# Patient Record
Sex: Male | Born: 1950 | Race: White | Hispanic: No | Marital: Married | Smoking: Former smoker
Health system: Southern US, Community
[De-identification: ages and names within clinical notes are randomized; demographics above are authoritative.]

## PROBLEM LIST (undated history)

## (undated) DIAGNOSIS — K900 Celiac disease: Secondary | ICD-10-CM

## (undated) DIAGNOSIS — M1990 Unspecified osteoarthritis, unspecified site: Secondary | ICD-10-CM

## (undated) DIAGNOSIS — G4730 Sleep apnea, unspecified: Secondary | ICD-10-CM

## (undated) DIAGNOSIS — K219 Gastro-esophageal reflux disease without esophagitis: Secondary | ICD-10-CM

## (undated) DIAGNOSIS — E119 Type 2 diabetes mellitus without complications: Secondary | ICD-10-CM

## (undated) DIAGNOSIS — K5790 Diverticulosis of intestine, part unspecified, without perforation or abscess without bleeding: Secondary | ICD-10-CM

## (undated) DIAGNOSIS — K648 Other hemorrhoids: Secondary | ICD-10-CM

## (undated) DIAGNOSIS — K2210 Ulcer of esophagus without bleeding: Secondary | ICD-10-CM

## (undated) DIAGNOSIS — E785 Hyperlipidemia, unspecified: Secondary | ICD-10-CM

## (undated) HISTORY — DX: Ulcer of esophagus without bleeding: K22.10

## (undated) HISTORY — DX: Type 2 diabetes mellitus without complications: E11.9

## (undated) HISTORY — PX: COLONOSCOPY: SHX174

## (undated) HISTORY — DX: Other hemorrhoids: K64.8

## (undated) HISTORY — DX: Hyperlipidemia, unspecified: E78.5

## (undated) HISTORY — DX: Sleep apnea, unspecified: G47.30

## (undated) HISTORY — DX: Celiac disease: K90.0

## (undated) HISTORY — PX: TONSILLECTOMY: SUR1361

## (undated) HISTORY — DX: Unspecified osteoarthritis, unspecified site: M19.90

## (undated) HISTORY — DX: Diverticulosis of intestine, part unspecified, without perforation or abscess without bleeding: K57.90

## (undated) HISTORY — DX: Gastro-esophageal reflux disease without esophagitis: K21.9

## (undated) HISTORY — PX: POLYPECTOMY: SHX149

## (undated) HISTORY — PX: UPPER GASTROINTESTINAL ENDOSCOPY: SHX188

---

## 2003-04-16 ENCOUNTER — Ambulatory Visit (HOSPITAL_COMMUNITY): Admission: RE | Admit: 2003-04-16 | Discharge: 2003-04-16 | Payer: Self-pay | Admitting: *Deleted

## 2003-04-16 ENCOUNTER — Encounter (INDEPENDENT_AMBULATORY_CARE_PROVIDER_SITE_OTHER): Payer: Self-pay | Admitting: Specialist

## 2005-07-10 ENCOUNTER — Encounter (INDEPENDENT_AMBULATORY_CARE_PROVIDER_SITE_OTHER): Payer: Self-pay | Admitting: *Deleted

## 2005-07-10 ENCOUNTER — Ambulatory Visit (HOSPITAL_COMMUNITY): Admission: RE | Admit: 2005-07-10 | Discharge: 2005-07-10 | Payer: Self-pay | Admitting: *Deleted

## 2005-09-28 ENCOUNTER — Encounter (INDEPENDENT_AMBULATORY_CARE_PROVIDER_SITE_OTHER): Payer: Self-pay | Admitting: *Deleted

## 2005-09-28 ENCOUNTER — Ambulatory Visit (HOSPITAL_COMMUNITY): Admission: RE | Admit: 2005-09-28 | Discharge: 2005-09-28 | Payer: Self-pay | Admitting: *Deleted

## 2005-12-03 ENCOUNTER — Ambulatory Visit (HOSPITAL_COMMUNITY): Admission: RE | Admit: 2005-12-03 | Discharge: 2005-12-03 | Payer: Self-pay | Admitting: *Deleted

## 2007-08-22 ENCOUNTER — Encounter: Admission: RE | Admit: 2007-08-22 | Discharge: 2007-08-22 | Payer: Self-pay | Admitting: Endocrinology

## 2010-12-08 NOTE — Op Note (Signed)
NAME:  Trevor Evans, Trevor Evans             ACCOUNT NO.:  [id number]   MEDICAL RECORD NO.:  [id number]          PATIENT TYPE:  AMB   LOCATION:  ENDO                         FACILITY:  [facility]   PHYSICIAN:  George M. Orr, M.D.    DATE OF BIRTH:  05/24/1951   DATE OF PROCEDURE:  09/28/2005  DATE OF DISCHARGE:                                 OPERATIVE REPORT   PROCEDURE:  Upper endoscopy with biopsy.   INDICATIONS FOR PROCEDURE:  GERD.   ANESTHESIA:  Demerol and Versed.   PROCEDURE:  With the patient mildly sedated in the left lateral decubitus  position, the Olympus videoscopic endoscope was inserted and passed under  direct vision through the esophagus which appeared normal until we reached  distal esophagus and there was question of Barrett's photographed, biopsied.  We entered into the stomach.  The fundus, body, antrum and duodenal bulb,  second portion duodenum all appeared normal. From this point the endoscope  was slowly withdrawn taking circumferential views of duodenal mucosa until  the endoscope been pulled back in the stomach placed in retroflexion, view  the stomach from below.  The endoscope was straightened, withdrawn taking  circumferential views remaining gastric and esophageal mucosa. The patient's  vital signs, pulse oximeter remained stable. The patient tolerated procedure  well without apparent complications.   FINDINGS:  Question of Barrett's esophagus, biopsied. Await biopsy report.  The patient will call me for results and follow-up with me as an outpatient.           ______________________________  George M. Orr, M.D.     GMO/MEDQ  D:  09/28/2005  T:  09/29/2005  Job:  [id number] 

## 2010-12-08 NOTE — Op Note (Signed)
[name], Trevor Evans             ACCOUNT NO.:  [id number]   MEDICAL RECORD NO.:  [id number]          PATIENT TYPE:  AMB   LOCATION:  ENDO                         FACILITY:  [facility]   PHYSICIAN:  Georgiana Spinner, M.D.    DATE OF BIRTH:  Oct 25, 1950   DATE OF PROCEDURE:  09/28/2005  DATE OF DISCHARGE:                                 OPERATIVE REPORT   PROCEDURE:  Upper endoscopy with biopsy.   INDICATIONS FOR PROCEDURE:  GERD.   ANESTHESIA:  Demerol and Versed.   PROCEDURE:  With the patient mildly sedated in the left lateral decubitus  position, the Olympus videoscopic endoscope was inserted and passed under  direct vision through the esophagus which appeared normal until we reached  distal esophagus and there was question of Barrett's photographed, biopsied.  We entered into the stomach.  The fundus, body, antrum and duodenal bulb,  second portion duodenum all appeared normal. From this point the endoscope  was slowly withdrawn taking circumferential views of duodenal mucosa until  the endoscope been pulled back in the stomach placed in retroflexion, view  the stomach from below.  The endoscope was straightened, withdrawn taking  circumferential views remaining gastric and esophageal mucosa. The patient's  vital signs, pulse oximeter remained stable. The patient tolerated procedure  well without apparent complications.   FINDINGS:  Question of Barrett's esophagus, biopsied. Await biopsy report.  The patient will call me for results and follow-up with me as an outpatient.           ______________________________  Georgiana Spinner, M.D.     GMO/MEDQ  D:  09/28/2005  T:  09/29/2005  Job:  [id number]

## 2010-12-08 NOTE — Op Note (Signed)
Trevor Evans, Trevor Evans             ACCOUNT NO.:  [id number]   MEDICAL RECORD NO.:  [id number]          PATIENT TYPE:  AMB   LOCATION:  ENDO                         FACILITY:  [facility]   PHYSICIAN:  Georgiana Spinner, M.D.    DATE OF BIRTH:  [birth year]   DATE OF PROCEDURE:  07/10/2005  DATE OF DISCHARGE:                                 OPERATIVE REPORT   PROCEDURE:  Colonoscopy.   ENDOSCOPIST:  Georgiana Spinner, M.D.   INDICATIONS:  Hemoccult positivity.   ANESTHESIA:  Demerol 50 mg, Versed 3 mg.   PROCEDURE:  With the patient mildly sedated in the left lateral decubitus  position, a rectal examination was attempted.  The prostate could not be  palpated, but subsequently the Olympus videoscopic colonoscope was inserted  in the rectum and passed through a tight diverticular-filled sigmoid colon  to reach the cecum, identified by ileocecal valve and appendiceal orifice,  both of which were photographed.  From this point the colonoscope was slowly  withdrawn, taking circumferential views of the colonic mucosa, stopping then  only in the rectum, which appeared normal on direct and showed hemorrhoids  on retroflexed view.  The endoscope was straightened and withdrawn.  The  patient's vital signs and pulse oximetry remained stable.  The patient  tolerated the procedure well without apparent complication.   FINDINGS:  1.  Diverticulosis of sigmoid colon.  2.  Internal hemorrhoids.  3.  Otherwise an unremarkable colonoscopic examination to the cecum.   PLAN:  See endoscopy notes for further plan for followup.           ______________________________  Georgiana Spinner, M.D.     GMO/MEDQ  D:  07/10/2005  T:  07/12/2005  Job:  [id number]

## 2010-12-08 NOTE — Op Note (Signed)
Trevor Evans, Trevor Evans             ACCOUNT NO.:  [id number]   MEDICAL RECORD NO.:  [id number]          PATIENT TYPE:  AMB   LOCATION:  ENDO                         FACILITY:  [facility]   PHYSICIAN:  Georgiana Spinner, M.D.    DATE OF BIRTH:  [birth year]   DATE OF PROCEDURE:  07/10/2005  DATE OF DISCHARGE:  07/10/2005                                 OPERATIVE REPORT   PROCEDURE:  Upper endoscopy.   ENDOSCOPIST:  Georgiana Spinner, M.D.   INDICATIONS:  Hemoccult positivity.   ANESTHESIA:  Demerol 50 mg, Versed 5 mg.   PROCEDURE:  With the patient mildly sedated in the left lateral decubitus  position, the Olympus videoscopic endoscope was inserted in the mouth and  passed under direct vision through the esophagus which appeared normal until  it reached the distal esophagus and there were discrete ulcerations seen and  numerous small ulcers, one long linear ulcer. These were photographed and  biopsied.  They appeared to be NSAID-induced. We saw a small polyp in the  gastric fundus just below the squamocolumnar junction.  This was  photographed and biopsied as well in a separate container.  We advanced into  the stomach.  The fundus, body, antrum, duodenal bulb and second portion of  the duodenum were visualized.  From this point, the endoscope was slowly  withdrawn taking circumferential views of the duodenal mucosa until the  endoscope had been pulled back into the stomach, placed in retroflexion to  view the stomach from below.  The endoscope was then straightened and  withdrawn taking circumferential views of the remaining gastric and  esophageal mucosa. The patient's vital signs, pulse oximeter remained  stable. The patient tolerated procedure well without apparent complications.   FINDINGS:  Small polyp of fundus biopsied.  Ulcerations of distal esophagus  presumably NSAID induced.   PLAN:  1.  Await biopsy report, but in the interim will start the patient on PPI,      possibly  over-the-counter Prilosec, to be taken daily and will have the      patient follow-up with me as an outpatient.  2.  Proceed to colonoscopy as planned.           ______________________________  Georgiana Spinner, M.D.     GMO/MEDQ  D:  07/10/2005  T:  07/12/2005  Job:  [id number]

## 2010-12-08 NOTE — Op Note (Signed)
   NAME:  Trevor Evans, Trevor Evans                       ACCOUNT NO.:  [id number]   MEDICAL RECORD NO.:  [id number]                   PATIENT TYPE:  AMB   LOCATION:  ENDO                                 [facility]ACILITY:  [facility]   PHYSICIAN:  Georgiana Spinner, M.D.                 DATE OF BIRTH:  1950/10/18   DATE OF PROCEDURE:  04/16/2003  DATE OF DISCHARGE:                                 OPERATIVE REPORT   PROCEDURE:  Colonoscopy.   INDICATIONS:  Colon polyp.   ANESTHESIA:  1. Demerol 100 mg.  2. Versed 9 mg.   DESCRIPTION OF PROCEDURE:  With patient mildly sedated in the left lateral  decubitus position, the Olympus videoscopic colonoscope was inserted in the  rectum, passed under direct vision to the cecum, identified by the ileocecal  valve and the appendiceal orifice, both of which were photographed.  From  this point, the colonoscope was slowly withdrawn, taking circumferential  views of the colonic mucosa, stopping to photograph diverticula seen along  the way in the sigmoid colon until we reached the area of approximately 30  cm from the anal verge at which a small polyp was seen, photographed, and  removed using hot biopsy forceps technique, setting of 20-20 blended  current.  Tissue was retrieved.  The endoscope was then withdrawn all the  way to the rectum which appeared normal on direct and showed normal on  retroflexed view as well.  The endoscope was straightened and withdrawn.  The patient's vital signs and pulse oximeter remained stable.  The patient  tolerated the procedure well without apparent complications.   FINDINGS:  Diverticulosis of sigmoid colon with a small polyp as noted.   PLAN:  1. Await biopsy report.  2. The patient will call me for results and follow up with me as an     outpatient.                                               Georgiana Spinner, M.D.    GMO/MEDQ  D:  04/16/2003  T:  04/16/2003  Job:  [id number]

## 2011-03-13 ENCOUNTER — Ambulatory Visit: Payer: Self-pay | Admitting: Internal Medicine

## 2011-04-09 ENCOUNTER — Encounter: Payer: Self-pay | Admitting: Internal Medicine

## 2011-04-09 ENCOUNTER — Ambulatory Visit: Payer: BC Managed Care – PPO

## 2011-04-09 ENCOUNTER — Ambulatory Visit (INDEPENDENT_AMBULATORY_CARE_PROVIDER_SITE_OTHER): Payer: BC Managed Care – PPO | Admitting: Internal Medicine

## 2011-04-09 VITALS — BP 130/70 | HR 68 | Ht 68.0 in | Wt 284.0 lb

## 2011-04-09 DIAGNOSIS — E785 Hyperlipidemia, unspecified: Secondary | ICD-10-CM | POA: Insufficient documentation

## 2011-04-09 DIAGNOSIS — K900 Celiac disease: Secondary | ICD-10-CM

## 2011-04-09 DIAGNOSIS — Z1211 Encounter for screening for malignant neoplasm of colon: Secondary | ICD-10-CM

## 2011-04-09 LAB — IGA: IgA: 284 mg/dL (ref 68–378)

## 2011-04-09 LAB — IBC PANEL: Saturation Ratios: 6.7 % — ABNORMAL LOW (ref 20.0–50.0)

## 2011-04-09 LAB — FERRITIN: Ferritin: 11.4 ng/mL — ABNORMAL LOW (ref 22.0–322.0)

## 2011-04-09 NOTE — Patient Instructions (Signed)
You have been scheduled for labs today please go to the basement to have this done. Please call our office if you develop any symptoms otherwise follow up as needed. You have been put in our system for a recall colonoscopy 03/2015.

## 2011-04-09 NOTE — Progress Notes (Signed)
Subjective:    Patient ID: Trevor Evans, male    DOB: [birth year], 60 y.o.   MRN: [id number]  HPI Trevor Evans is a 60 year old male with a PMH of celiac disease, diet controlled diabetes, hyperlipidemia and sleep apnea who is referred by Trevor Evans for consideration of repeat colonoscopy and for a celiac disease. The patient reports today he is doing very well. He denies any abdominal complaints. He reports no abdominal pain, no nausea, no vomiting. His appetite has been good. His weight has been stable. He denies heartburn of late.  No dysphagia or odynophagia.  He reports normal bowel movements without constipation or diarrhea. He denies bright red blood per rectum or melena. No fevers or chills.  The patient states that he was diagnosed with celiac disease about 5 years ago after being found to have iron deficiency.  He reports no GI symptoms at that time. He reports the diagnosis was by serology. To his knowledge he has never had small bowel biopsy. He reports that he maintains a strict gluten free diet and with this his iron studies have reportedly normalized.   Review of Systems Constitutional: Negative for fever, chills, night sweats, activity change, appetite change and unexpected weight change HEENT: Negative for sore throat, mouth sores and trouble swallowing. Eyes: Negative for visual disturbance Respiratory: Negative for cough, chest tightness and shortness of breath Cardiovascular: Negative for chest pain, palpitations and lower extremity swelling Gastrointestinal: See history of present illness Genitourinary: Negative for dysuria and hematuria. Musculoskeletal: Negative for back pain, arthralgias and myalgias Skin: Negative for rash or color change Neurological: Negative for headaches, weakness, numbness Hematological: Negative for adenopathy, negative for easy bruising/bleeding Psychiatric/behavioral: Negative for depressed mood, negative for anxiety   Past Medical  History  Diagnosis Date  . Celiac disease   . Hyperlipidemia   . Diabetes mellitus type II     diet controlled  . GERD (gastroesophageal reflux disease)   . Sleep apnea   . Diverticulosis   . Internal hemorrhoids   . Esophageal ulcer   . GI bleed    Current Outpatient Prescriptions  Medication Sig Dispense Refill  . aspirin 81 MG tablet Take 81 mg by mouth daily.        . Multiple Vitamins-Minerals (CENTRUM SILVER PO) Take 1 tablet by mouth daily.         No Known Allergies  Family History  Problem Relation Age of Onset  . Diabetes Father   . Kidney disease Father   . Emphysema Father   . Migraines Mother   --neg for CRC or colon polyps.  No known celiac   Social History  . Marital Status: Married    Number of Children: 1   Occupational History  . teacher [employer]   Social History Main Topics  . Smoking status: Former Smoker    Quit date: 07/23/1973  . Smokeless tobacco: Never Used  . Alcohol Use: No  . Drug Use: No      Objective:   Physical Exam BP 130/70  Pulse 68  Ht 5\' 8"  (1.727 m)  Wt 284 lb (128.822 kg)  BMI 43.18 kg/m2 Constitutional: Well-developed and well-nourished. No distress. HEENT: Normocephalic and atraumatic. Oropharynx is clear and moist. No oropharyngeal exudate. Conjunctivae are normal. Pupils are equal round and reactive to light. No scleral icterus. Neck: Neck supple. Trachea midline. Cardiovascular: Normal rate, regular rhythm and intact distal pulses. No M/R/G Pulmonary/chest: Effort normal and breath sounds normal. No wheezing, rales or rhonchi.  Abdominal: Soft, obese, nontender, nondistended. Bowel sounds active throughout. There are no masses palpable. No hepatosplenomegaly. Lymphadenopathy: No cervical adenopathy noted. Neurological: Alert and oriented to person place and time. Skin: Skin is warm and dry. No rashes noted.  Several small cherry angiomas noted anterior abdominal wall Psychiatric: Normal mood and affect.  Behavior is normal.  Prior Procedures: Colonoscopy 07/10/2005: Diverticulosis of the sigmoid, internal hemorrhoids, otherwise an unremarkable colonoscopic examination to the cecum Colonoscopy 04/16/2003: Diverticulosis of the sigmoid, with small polyp in the sigmoid removed by biopsy. Pathology: Benign colonic mucosa  With benign lymphoid aggregate. No adenomatous change or malignancy identified.  EGD 09/28/2005: Question of Barrett's esophagus, biopsied. Pathology: Esophagogastric junction mucosa with mild inflammation. No intestinal metaplasia dysplasia or malignancy  identifed EGD 07/10/2005: Small polyp in the fundus biopsy. Ulcerations of distal esophagus presumably NSAID-induced. Pathology: Mucosal ulceration associated with inflamed granulation tissue in the ulcer bed. No intestinal metaplasia, atypia or evidence of malignancy identified. Stomach: Gastroesophageal junction type mucosa associated with marked acute and chronic inflammation and hyperplastic/regenerative changes. No definite organism or H. pylori identified. No intestinal metaplasia or evidence of malignancy.  Labs: 02/14/2011: WBC 7.4, hemoglobin of 13.1, hematocrit 39.0, MCV 82.8, platelet count 257 Sodium 136 potassium 4.6 chloride 103 carbon monoxide 30 BUN 13 creatinine 1.1 Calcium 8.5 total protein 7.2 albumin 3.4 globulin 3.8 Total bili 0.3 alk phosphatase 110 AST 15 ALT 31 A1c 6.3     Assessment & Plan:  This is a 60 year old male with a PMH of celiac disease, diet controlled diabetes, hyperlipidemia and sleep apnea who is referred by his PCP for consideration of repeat colonoscopy and for a celiac disease  1. Celiac disease -- the patient had mild celiac disease, though apparently a significant iron deficiency at diagnosis.  He is maintained on a strict gluten-free diet and is without symptoms. His MCV is now normal which suggests his iron stores have been repleted.  I will repeat his serum TTG antibody today to  ensure normalization on the gluten-free diet. I'll also order iron studies. At present I do not see any intervention necessary given his clinical well being. I've advised that he contact me should symptoms arise for further evaluation.  2. CRC screening -- the patient is deemed average risk. He has had 2 colonoscopies, most recently 2006, and he does not have a history of prior adenomatous polyp. Based on these findings and our current screening guidelines he would be due for repeat colon screening in 2016. We will arrange this followup for him at that time. I have again advised should his family history change or he develop symptoms then an earlier exam would likely be warranted. He voices understanding  Return as needed

## 2011-04-10 LAB — TISSUE TRANSGLUTAMINASE, IGA: Tissue Transglutaminase Ab, IgA: 3.6 U/mL (ref <20)

## 2011-04-11 ENCOUNTER — Telehealth: Payer: Self-pay | Admitting: *Deleted

## 2011-04-11 NOTE — Telephone Encounter (Signed)
lmom for pt to call back. Reminder entered for 6 month labs.

## 2011-04-11 NOTE — Telephone Encounter (Signed)
Message copied by Florene Glen on Wed Apr 11, 2011  4:01 PM ------      Message from: Beverley Fiedler      Created: Tue Apr 10, 2011  3:30 PM       Aram Beecham      Please let pt know the results of his recent labs.      Iron is still a bit low, but TTG is negative --indicating his adherence to his gluten free diet.      I recommend iron supplementation 325 mg daily po x 6 months and then repeat iron studies (iron, TIBC, ferritin) at the 6 month mark      Given lack of symptoms this can be done without an MD visit.      Can give colace 100 mg daily if iron causes constipation.      Thanks.

## 2011-04-12 NOTE — Telephone Encounter (Signed)
Notified pt his iron is low but the TTG result means he is compliant with a gluten free diet. He needs to take an OTC iron supplement 325mg  daily x 6 months and we will recheck his iron studies; he does not need a visit d/t asymptomatic. Cautioned pt iron may turn his stool dark and can be constipating. We can order colace if he needs it or he can take an OTC stool softener; pt stated understanding.

## 2011-10-09 ENCOUNTER — Telehealth: Payer: Self-pay | Admitting: *Deleted

## 2011-10-09 DIAGNOSIS — K900 Celiac disease: Secondary | ICD-10-CM

## 2011-10-09 NOTE — Telephone Encounter (Signed)
Message copied by Florene Glen on Tue Oct 09, 2011  2:41 PM ------      Message from: Florene Glen      Created: Wed Apr 11, 2011  4:03 PM      Regarding: labs in 6 months       Pt needs iron, tibc, ferritin in 6 months

## 2011-10-09 NOTE — Telephone Encounter (Signed)
lmom for pt to call back. Per notes from 04/10/12 telephone call, pt should begin OTC iron and recheck IRON, TIBC, and FERRITIN in 6 months.

## 2011-10-24 NOTE — Telephone Encounter (Signed)
Spoke with pt who stated he has been taking OTC iron. Informed him he needs his iron levels checked. Pt stated understanding and will come in at his convenience for Iron, TIBC, Ferritin.

## 2014-06-19 ENCOUNTER — Emergency Department (HOSPITAL_COMMUNITY)
Admission: EM | Admit: 2014-06-19 | Discharge: 2014-06-19 | Disposition: A | Discharge status: Home / Self Care | Payer: BC Managed Care – PPO | Attending: Emergency Medicine | Admitting: Emergency Medicine

## 2014-06-19 ENCOUNTER — Encounter (HOSPITAL_COMMUNITY): Payer: Self-pay | Admitting: *Deleted

## 2014-06-19 ENCOUNTER — Emergency Department (HOSPITAL_COMMUNITY): Payer: BC Managed Care – PPO

## 2014-06-19 DIAGNOSIS — M545 Low back pain: Secondary | ICD-10-CM | POA: Insufficient documentation

## 2014-06-19 DIAGNOSIS — E785 Hyperlipidemia, unspecified: Secondary | ICD-10-CM | POA: Diagnosis not present

## 2014-06-19 DIAGNOSIS — Z87891 Personal history of nicotine dependence: Secondary | ICD-10-CM | POA: Insufficient documentation

## 2014-06-19 DIAGNOSIS — Z8669 Personal history of other diseases of the nervous system and sense organs: Secondary | ICD-10-CM | POA: Insufficient documentation

## 2014-06-19 DIAGNOSIS — Z8719 Personal history of other diseases of the digestive system: Secondary | ICD-10-CM | POA: Insufficient documentation

## 2014-06-19 DIAGNOSIS — Z7982 Long term (current) use of aspirin: Secondary | ICD-10-CM | POA: Diagnosis not present

## 2014-06-19 DIAGNOSIS — E119 Type 2 diabetes mellitus without complications: Secondary | ICD-10-CM | POA: Diagnosis not present

## 2014-06-19 DIAGNOSIS — M549 Dorsalgia, unspecified: Secondary | ICD-10-CM

## 2014-06-19 MED ORDER — NAPROXEN 500 MG PO TABS: 500.0000 mg | ORAL_TABLET | Freq: Two times a day (BID) | ORAL | Status: DC

## 2014-06-19 MED ORDER — HYDROCODONE-ACETAMINOPHEN 5-325 MG PO TABS: 2.0000 | ORAL_TABLET | ORAL | Status: DC | PRN

## 2014-06-19 MED ORDER — OXYCODONE-ACETAMINOPHEN 5-325 MG PO TABS
1.0000 | ORAL_TABLET | Freq: Once | ORAL | Status: AC
  Administered 2014-06-19: 1 via ORAL
  Filled 2014-06-19: qty 1

## 2014-06-19 NOTE — ED Notes (Signed)
Pt reports hx of osteoarthritis, pt bent down to pick something up last night and now having right side lower back pain. Ambulatory at triage.

## 2014-06-19 NOTE — ED Notes (Signed)
Pt reports rt sided back pain that shoots down Rt leg. Pain is worse with activity.

## 2014-06-19 NOTE — Discharge Instructions (Signed)
Back Pain, Adult °Low back pain is very common. About 1 in 5 people have back pain. The cause of low back pain is rarely dangerous. The pain often gets better over time. About half of people with a sudden onset of back pain feel better in just 2 weeks. About 8 in 10 people feel better by 6 weeks.  °CAUSES °Some common causes of back pain include: °· Strain of the muscles or ligaments supporting the spine. °· Wear and tear (degeneration) of the spinal discs. °· Arthritis. °· Direct injury to the back. °DIAGNOSIS °Most of the time, the direct cause of low back pain is not known. However, back pain can be treated effectively even when the exact cause of the pain is unknown. Answering your caregiver's questions about your overall health and symptoms is one of the most accurate ways to make sure the cause of your pain is not dangerous. If your caregiver needs more information, he or she may order lab work or imaging tests (X-rays or MRIs). However, even if imaging tests show changes in your back, this usually does not require surgery. °HOME CARE INSTRUCTIONS °For many people, back pain returns. Since low back pain is rarely dangerous, it is often a condition that people can learn to manage on their own.  °· Remain active. It is stressful on the back to sit or stand in one place. Do not sit, drive, or stand in one place for more than 30 minutes at a time. Take short walks on level surfaces as soon as pain allows. Try to increase the length of time you walk each day. °· Do not stay in bed. Resting more than 1 or 2 days can delay your recovery. °· Do not avoid exercise or work. Your body is made to move. It is not dangerous to be active, even though your back may hurt. Your back will likely heal faster if you return to being active before your pain is gone. °· Pay attention to your body when you  bend and lift. Many people have less discomfort when lifting if they bend their knees, keep the load close to their bodies, and  avoid twisting. Often, the most comfortable positions are those that put less stress on your recovering back. °· Find a comfortable position to sleep. Use a firm mattress and lie on your side with your knees slightly bent. If you lie on your back, put a pillow under your knees. °· Only take over-the-counter or prescription medicines as directed by your caregiver. Over-the-counter medicines to reduce pain and inflammation are often the most helpful. Your caregiver may prescribe muscle relaxant drugs. These medicines help dull your pain so you can more quickly return to your normal activities and healthy exercise. °· Put ice on the injured area. °¨ Put ice in a plastic bag. °¨ Place a towel between your skin and the bag. °¨ Leave the ice on for 15-20 minutes, 03-04 times a day for the first 2 to 3 days. After that, ice and heat may be alternated to reduce pain and spasms. °· Ask your caregiver about trying back exercises and gentle massage. This may be of some benefit. °· Avoid feeling anxious or stressed. Stress increases muscle tension and can worsen back pain. It is important to recognize when you are anxious or stressed and learn ways to manage it. Exercise is a great option. °SEEK MEDICAL CARE IF: °· You have pain that is not relieved with rest or medicine. °· You have pain that does not improve in 1 week. °· You have new symptoms. °· You are generally not feeling well. °SEEK   IMMEDIATE MEDICAL CARE IF:   You have pain that radiates from your back into your legs.  You develop new bowel or bladder control problems.  You have unusual weakness or numbness in your arms or legs.  You develop nausea or vomiting.  You develop abdominal pain.  You feel faint. Document Released: 07/09/2005 Document Revised: 01/08/2012 Document Reviewed: 11/10/2013 [facility] Patient Information 2015 [place], [place]. This information is not intended to replace advice given to you by your health care provider. Make sure you  discuss any questions you have with your health care provider.   You were evaluated in the ED today for your back pain. There is not appear to be an emergent source for your back pain at this time. You may follow-up with your primary care within the next 3-5 days for further evaluation and management. Return to ED if you begin to experience loss of bowel or bladder, numbness or weakness, worsening symptoms. Please take the naproxen as directed every day for the next 2 weeks to help with pain and inflammation. He may use the Vicodin as prescribed for breakthrough pain management

## 2014-06-19 NOTE — ED Provider Notes (Signed)
CSN: [id number]     Arrival date & time 06/19/14  0903 History  This chart was scribed for Comer Locket, PA-C, working with Charlesetta Shanks, MD by Steva Colder, ED Scribe. The patient was seen in room TR06C/TR06C at 9:17 AM.     Chief Complaint  Patient presents with  . Back Pain     The history is provided by the patient. No language interpreter was used.  HPI Comments: Trevor Evans is a 63 y.o. male with a medical hx of osteoarthritis who presents to the Emergency Department complaining of right low back pain onset last night. He reports that he was walking to his  Car and putting stuff in his car and when he bent there is a stabbing pain on his right side of his back. He was with his mother at the time while she was getting discharged from [facility] yesterday. He took a muscle relaxer. His pain at the moment while sitting is not bad. When he laid on his back the pain was bearable, laying on the affected side there was no pain. If he was to lay on his left side there would be excruciating pain. If the pt moves then the pain shoots down his right leg.  He is having associated symptoms of intermittent numbness of the right leg. He denies weakness, urinary incontinence, bowel incontinence, fever, and any other symptoms. Denies hx of CA steroid use, kidney issues, and IV drug use.    Past Medical History  Diagnosis Date  . Celiac disease   . Hyperlipidemia   . Diabetes mellitus type II     diet controlled  . GERD (gastroesophageal reflux disease)   . Sleep apnea   . Diverticulosis   . Internal hemorrhoids   . Esophageal ulcer   . GI bleed    Past Surgical History  Procedure Laterality Date  . Tonsillectomy     Family History  Problem Relation Age of Onset  . Diabetes Father   . Kidney disease Father   . Emphysema Father   . Migraines Mother    History  Substance Use Topics  . Smoking status: Former Smoker    Quit date: 07/23/1973  . Smokeless tobacco: Never Used  .  Alcohol Use: No    Review of Systems  Constitutional: Negative for fever.  Gastrointestinal:       No bowel incontinence  Genitourinary:       No urinary incontinence  Musculoskeletal: Positive for back pain.  Neurological: Positive for numbness. Negative for weakness.  All other systems reviewed and are negative.  Allergies  Review of patient's allergies indicates no known allergies.  Home Medications   Prior to Admission medications   Medication Sig Start Date End Date Taking? Authorizing Provider  aspirin 81 MG tablet Take 81 mg by mouth daily.      Historical Provider, MD  HYDROcodone-acetaminophen (NORCO/VICODIN) 5-325 MG per tablet Take 2 tablets by mouth every 4 (four) hours as needed for moderate pain or severe pain. 06/19/14   Verl Dicker, PA-C  Multiple Vitamins-Minerals (CENTRUM SILVER PO) Take 1 tablet by mouth daily.      Historical Provider, MD  naproxen (NAPROSYN) 500 MG tablet Take 1 tablet (500 mg total) by mouth 2 (two) times daily. 06/19/14   Viona Gilmore Charleen Madera, PA-C   BP 147/79 mmHg  Pulse 76  Temp(Src) 98.3 F (36.8 C) (Oral)  Resp 18  SpO2 95%   Physical Exam  Constitutional:  Awake, alert, nontoxic appearance  with baseline speech.  HENT:  Head: Atraumatic.  Eyes: Pupils are equal, round, and reactive to light. Right eye exhibits no discharge. Left eye exhibits no discharge.  Neck: Neck supple.  Cardiovascular: Normal rate, regular rhythm, S1 normal, S2 normal and normal heart sounds.  Exam reveals no gallop and no friction rub.   No murmur heard. No tachycardia  Pulmonary/Chest: Effort normal and breath sounds normal. No respiratory distress. He has no wheezes. He has no rales. He exhibits no tenderness.  Abdominal: Soft. Bowel sounds are normal. He exhibits no mass. There is no tenderness. There is no rebound.  Musculoskeletal:       Thoracic back: He exhibits no tenderness.       Lumbar back: He exhibits no tenderness and no bony  tenderness.  Bilateral lower extremities non tender without new rashes or color change, baseline ROM with intact DP / PT pulses, CR<2 secs all digits bilaterally, sensation baseline light touch bilaterally for pt, DTR's symmetric and intact bilaterally KJ / AJ, motor symmetric bilateral 5 / 5 hip flexion, quadriceps, hamstrings, EHL, foot dorsiflexion, foot plantarflexion, gait somewhat antalgic but without apparent new ataxia.  Neurological:  Mental status baseline for patient.  Upper and lower extremity motor strength and sensation intact and symmetric bilaterally. No focal neurodeficits. Patient gait is baseline with mild antalgia but without any overt ataxia. No saddle paresthesia   Skin: No rash noted.  Psychiatric: He has a normal mood and affect.  Nursing note and vitals reviewed.   ED Course  Procedures (including critical care time) DIAGNOSTIC STUDIES: Oxygen Saturation is 95% on room air, adequate by my interpretation.    COORDINATION OF CARE: 9:25 AM-Discussed treatment plan which includes lumbar spine X-Ray with pt at bedside and pt agreed to plan.   Labs Review Labs Reviewed - No data to display  Imaging Review Dg Lumbar Spine Complete  06/19/2014   CLINICAL DATA:  Acute low back and right lower extremity pain after bending injury last night.  EXAM: LUMBAR SPINE - COMPLETE 4+ VIEW  COMPARISON:  11/11/2013  FINDINGS: There is no evidence of lumbar spine fracture. Alignment is normal. Intervertebral disc spaces are maintained. Anterior endplate spurring E0-E2.  IMPRESSION: 1. Stable endplate spurring. No fracture or other acute abnormality.   Electronically Signed   By: Arne Cleveland M.D.   On: 06/19/2014 10:51     EKG Interpretation None      MDM  Vitals stable - WNL -afebrile Pt resting comfortably in ED. pain managed in ED. No other pathological back pain red flags. PE--not concerning further acute or emergent pathology. Patient experiences mild intermittent  radiculopathy. Normal neuro exam. Patient's gait is baseline with mild antalgia but without any overt ataxia.  Imaging--x-ray of lumbar spine shows no acute fractures, dislocations or other osseous abnormalities DDX --patient's symptomology likely due to lower lumbar strain. Given the patient presentation, clinical picture I doubt other serious or emergent pathology for his low back pain. No evidence of cauda equina, conus medullaris syndrome Will DC with naproxen for pain and inflammation management and Vicodin for breakthrough pain. Discussed f/u with PCP and return precautions, pt very amenable to plan. Patient stable, in good condition and is appropriate for discharge   Final diagnoses:  Back pain      I personally performed the services described in this documentation, which was scribed in my presence. The recorded information has been reviewed and is accurate.    Verl Dicker, PA-C 06/19/14 2050  Jeannie Done  Johnney Killian, MD 06/22/14 1215

## 2014-06-19 NOTE — ED Notes (Signed)
Declined W/C at D/C and was escorted to lobby by RN. 

## 2014-06-22 ENCOUNTER — Other Ambulatory Visit: Payer: BC Managed Care – PPO

## 2014-06-22 ENCOUNTER — Other Ambulatory Visit (HOSPITAL_COMMUNITY): Payer: Self-pay | Admitting: Endocrinology

## 2014-06-22 ENCOUNTER — Other Ambulatory Visit: Payer: Self-pay | Admitting: Endocrinology

## 2014-06-22 DIAGNOSIS — R7989 Other specified abnormal findings of blood chemistry: Secondary | ICD-10-CM

## 2014-06-22 DIAGNOSIS — R945 Abnormal results of liver function studies: Secondary | ICD-10-CM

## 2014-06-22 DIAGNOSIS — R1013 Epigastric pain: Secondary | ICD-10-CM

## 2014-06-23 ENCOUNTER — Ambulatory Visit (HOSPITAL_COMMUNITY)
Admission: RE | Admit: 2014-06-23 | Discharge: 2014-06-23 | Disposition: A | Discharge status: Home / Self Care | Payer: BC Managed Care – PPO | Source: Ambulatory Visit | Attending: Endocrinology | Admitting: Endocrinology

## 2014-06-23 DIAGNOSIS — R945 Abnormal results of liver function studies: Secondary | ICD-10-CM

## 2014-06-23 DIAGNOSIS — K8020 Calculus of gallbladder without cholecystitis without obstruction: Secondary | ICD-10-CM | POA: Diagnosis not present

## 2014-06-23 DIAGNOSIS — R7989 Other specified abnormal findings of blood chemistry: Secondary | ICD-10-CM | POA: Diagnosis not present

## 2014-06-23 DIAGNOSIS — R1013 Epigastric pain: Secondary | ICD-10-CM | POA: Diagnosis not present

## 2014-06-23 DIAGNOSIS — E119 Type 2 diabetes mellitus without complications: Secondary | ICD-10-CM | POA: Diagnosis not present

## 2014-06-25 ENCOUNTER — Other Ambulatory Visit: Payer: BC Managed Care – PPO

## 2014-07-02 ENCOUNTER — Ambulatory Visit (INDEPENDENT_AMBULATORY_CARE_PROVIDER_SITE_OTHER): Payer: Self-pay | Admitting: General Surgery

## 2014-07-05 NOTE — Pre-Procedure Instructions (Signed)
MUJAHID JALOMO  07/05/2014   Your procedure is scheduled on:  Thursday July 08, 2014 at 7:30 AM.  Report to [facility] Admitting at 5:30 AM.  Call this number if you have problems the morning of surgery: [phone]   If you have any questions M-F from 8am-4pm call: [phone]  Remember:   Do not eat food or drink liquids after midnight.   Take these medicines the morning of surgery with A SIP OF WATER: Hydrocodone if needed   Stop taking Multivitamin and Naproxen   Do not wear jewelry.  Do not wear lotions, powders, or cologne.   Men may shave face and neck.  Do not bring valuables to the hospital.  [facility] is not responsible for any belongings or valuables.               Contacts, dentures or bridgework may not be worn into surgery.  Leave suitcase in the car. After surgery it may be brought to your room.  For patients admitted to the hospital, discharge time is determined by your treatment team.               Patients discharged the day of surgery will not be allowed to drive home.  Name and phone number of your driver:   Special Instructions: Shower using CHG soap the night before and the morning of your surgery   Please read over the following fact sheets that you were given: Pain Booklet, Coughing and Deep Breathing and Surgical Site Infection Prevention

## 2014-07-06 ENCOUNTER — Encounter (HOSPITAL_COMMUNITY)
Admission: RE | Admit: 2014-07-06 | Discharge: 2014-07-06 | Disposition: A | Discharge status: Home / Self Care | Payer: BC Managed Care – PPO | Source: Ambulatory Visit | Attending: General Surgery | Admitting: General Surgery

## 2014-07-06 ENCOUNTER — Encounter (HOSPITAL_COMMUNITY): Payer: Self-pay

## 2014-07-06 DIAGNOSIS — K8020 Calculus of gallbladder without cholecystitis without obstruction: Secondary | ICD-10-CM | POA: Diagnosis not present

## 2014-07-06 DIAGNOSIS — Z87891 Personal history of nicotine dependence: Secondary | ICD-10-CM | POA: Diagnosis not present

## 2014-07-06 DIAGNOSIS — Z6841 Body Mass Index (BMI) 40.0 and over, adult: Secondary | ICD-10-CM | POA: Diagnosis not present

## 2014-07-06 DIAGNOSIS — E119 Type 2 diabetes mellitus without complications: Secondary | ICD-10-CM | POA: Diagnosis not present

## 2014-07-06 DIAGNOSIS — Z859 Personal history of malignant neoplasm, unspecified: Secondary | ICD-10-CM | POA: Diagnosis not present

## 2014-07-06 DIAGNOSIS — G4730 Sleep apnea, unspecified: Secondary | ICD-10-CM | POA: Diagnosis not present

## 2014-07-06 DIAGNOSIS — K219 Gastro-esophageal reflux disease without esophagitis: Secondary | ICD-10-CM | POA: Diagnosis not present

## 2014-07-06 LAB — CBC
HEMATOCRIT: 42.7 % (ref 39.0–52.0)
Hemoglobin: 14 g/dL (ref 13.0–17.0)
MCH: 27.2 pg (ref 26.0–34.0)
MCHC: 32.8 g/dL (ref 30.0–36.0)
MCV: 82.9 fL (ref 78.0–100.0)
Platelets: 252 10*3/uL (ref 150–400)
RBC: 5.15 MIL/uL (ref 4.22–5.81)
RDW: 14 % (ref 11.5–15.5)
WBC: 7 10*3/uL (ref 4.0–10.5)

## 2014-07-06 NOTE — Progress Notes (Signed)
Patient denied having any cardiac or pulmonary issues. Patient informed Nurse he was a "boarderline" diabetic but that was told this 2 years ago, and he does not check his blood sugar at home, nor does he take any medications, he is simply controlling his diet.

## 2014-07-07 MED ORDER — CHLORHEXIDINE GLUCONATE 4 % EX LIQD
1.0000 "application " | Freq: Once | CUTANEOUS | Status: DC
  Filled 2014-07-07: qty 15

## 2014-07-07 MED ORDER — DEXTROSE 5 % IV SOLN
3.0000 g | INTRAVENOUS | Status: AC
  Administered 2014-07-08: 3 g via INTRAVENOUS
  Filled 2014-07-07: qty 3000

## 2014-07-08 ENCOUNTER — Encounter (HOSPITAL_COMMUNITY): Payer: Self-pay | Admitting: *Deleted

## 2014-07-08 ENCOUNTER — Encounter (HOSPITAL_COMMUNITY)
Admission: RE | Disposition: A | Discharge status: Home / Self Care | Payer: Self-pay | Source: Ambulatory Visit | Attending: General Surgery

## 2014-07-08 ENCOUNTER — Ambulatory Visit (HOSPITAL_COMMUNITY): Payer: BC Managed Care – PPO | Admitting: Anesthesiology

## 2014-07-08 ENCOUNTER — Ambulatory Visit (HOSPITAL_COMMUNITY)
Admission: RE | Admit: 2014-07-08 | Discharge: 2014-07-08 | Disposition: A | Discharge status: Home / Self Care | Payer: BC Managed Care – PPO | Source: Ambulatory Visit | Attending: General Surgery | Admitting: General Surgery

## 2014-07-08 DIAGNOSIS — Z859 Personal history of malignant neoplasm, unspecified: Secondary | ICD-10-CM | POA: Insufficient documentation

## 2014-07-08 DIAGNOSIS — K219 Gastro-esophageal reflux disease without esophagitis: Secondary | ICD-10-CM | POA: Insufficient documentation

## 2014-07-08 DIAGNOSIS — E119 Type 2 diabetes mellitus without complications: Secondary | ICD-10-CM | POA: Insufficient documentation

## 2014-07-08 DIAGNOSIS — K8020 Calculus of gallbladder without cholecystitis without obstruction: Secondary | ICD-10-CM | POA: Insufficient documentation

## 2014-07-08 DIAGNOSIS — G4730 Sleep apnea, unspecified: Secondary | ICD-10-CM | POA: Insufficient documentation

## 2014-07-08 DIAGNOSIS — Z6841 Body Mass Index (BMI) 40.0 and over, adult: Secondary | ICD-10-CM | POA: Insufficient documentation

## 2014-07-08 DIAGNOSIS — Z87891 Personal history of nicotine dependence: Secondary | ICD-10-CM | POA: Insufficient documentation

## 2014-07-08 HISTORY — PX: CHOLECYSTECTOMY: SHX55

## 2014-07-08 LAB — GLUCOSE, CAPILLARY
GLUCOSE-CAPILLARY: 112 mg/dL — AB (ref 70–99)
Glucose-Capillary: 141 mg/dL — ABNORMAL HIGH (ref 70–99)

## 2014-07-08 SURGERY — LAPAROSCOPIC CHOLECYSTECTOMY
Anesthesia: General

## 2014-07-08 MED ORDER — BUPIVACAINE-EPINEPHRINE (PF) 0.25% -1:200000 IJ SOLN
INTRAMUSCULAR | Status: AC
  Filled 2014-07-08: qty 30

## 2014-07-08 MED ORDER — SODIUM CHLORIDE 0.9 % IR SOLN
Status: DC | PRN
  Administered 2014-07-08: 1

## 2014-07-08 MED ORDER — HYDROMORPHONE HCL 1 MG/ML IJ SOLN
0.2500 mg | INTRAMUSCULAR | Status: DC | PRN
  Administered 2014-07-08 (×2): 0.5 mg via INTRAVENOUS

## 2014-07-08 MED ORDER — PROPOFOL 10 MG/ML IV BOLUS
INTRAVENOUS | Status: AC
  Filled 2014-07-08: qty 20

## 2014-07-08 MED ORDER — ONDANSETRON HCL 4 MG/2ML IJ SOLN
INTRAMUSCULAR | Status: AC
  Filled 2014-07-08: qty 2

## 2014-07-08 MED ORDER — HYDROMORPHONE HCL 1 MG/ML IJ SOLN
INTRAMUSCULAR | Status: AC
  Administered 2014-07-08: 0.5 mg via INTRAVENOUS
  Filled 2014-07-08: qty 1

## 2014-07-08 MED ORDER — LACTATED RINGERS IV SOLN
INTRAVENOUS | Status: DC | PRN
  Administered 2014-07-08: 07:00:00 via INTRAVENOUS

## 2014-07-08 MED ORDER — 0.9 % SODIUM CHLORIDE (POUR BTL) OPTIME
TOPICAL | Status: DC | PRN
  Administered 2014-07-08: 1000 mL

## 2014-07-08 MED ORDER — FENTANYL CITRATE 0.05 MG/ML IJ SOLN
INTRAMUSCULAR | Status: AC
  Filled 2014-07-08: qty 5

## 2014-07-08 MED ORDER — MIDAZOLAM HCL 2 MG/2ML IJ SOLN
INTRAMUSCULAR | Status: AC
  Filled 2014-07-08: qty 2

## 2014-07-08 MED ORDER — OXYCODONE-ACETAMINOPHEN 7.5-325 MG PO TABS: 1.0000 | ORAL_TABLET | ORAL | Status: DC | PRN

## 2014-07-08 MED ORDER — SUCCINYLCHOLINE CHLORIDE 20 MG/ML IJ SOLN
INTRAMUSCULAR | Status: DC | PRN
  Administered 2014-07-08: 100 mg via INTRAVENOUS

## 2014-07-08 MED ORDER — ROCURONIUM BROMIDE 50 MG/5ML IV SOLN
INTRAVENOUS | Status: AC
  Filled 2014-07-08: qty 1

## 2014-07-08 MED ORDER — BUPIVACAINE HCL 0.25 % IJ SOLN
INTRAMUSCULAR | Status: DC | PRN
  Administered 2014-07-08: 20 mL

## 2014-07-08 MED ORDER — PROMETHAZINE HCL 25 MG/ML IJ SOLN: 6.2500 mg | INTRAMUSCULAR | Status: DC | PRN

## 2014-07-08 MED ORDER — PROPOFOL 10 MG/ML IV BOLUS
INTRAVENOUS | Status: DC | PRN
  Administered 2014-07-08: 30 mg via INTRAVENOUS
  Administered 2014-07-08: 50 mg via INTRAVENOUS
  Administered 2014-07-08: 200 mg via INTRAVENOUS

## 2014-07-08 MED ORDER — OXYCODONE HCL 5 MG PO TABS
ORAL_TABLET | ORAL | Status: AC
  Administered 2014-07-08: 10 mg
  Filled 2014-07-08: qty 2

## 2014-07-08 MED ORDER — FENTANYL CITRATE 0.05 MG/ML IJ SOLN
INTRAMUSCULAR | Status: DC | PRN
  Administered 2014-07-08: 50 ug via INTRAVENOUS
  Administered 2014-07-08: 100 ug via INTRAVENOUS

## 2014-07-08 MED ORDER — LIDOCAINE HCL (CARDIAC) 20 MG/ML IV SOLN
INTRAVENOUS | Status: DC | PRN
  Administered 2014-07-08: 80 mg via INTRAVENOUS

## 2014-07-08 MED ORDER — ACETAMINOPHEN 325 MG PO TABS
ORAL_TABLET | ORAL | Status: AC
  Administered 2014-07-08: 650 mg
  Filled 2014-07-08: qty 2

## 2014-07-08 MED ORDER — KETOROLAC TROMETHAMINE 30 MG/ML IJ SOLN
INTRAMUSCULAR | Status: AC
  Administered 2014-07-08: 30 mg via INTRAVENOUS
  Filled 2014-07-08: qty 1

## 2014-07-08 MED ORDER — MIDAZOLAM HCL 5 MG/5ML IJ SOLN
INTRAMUSCULAR | Status: DC | PRN
  Administered 2014-07-08: 2 mg via INTRAVENOUS

## 2014-07-08 MED ORDER — ONDANSETRON HCL 4 MG/2ML IJ SOLN
INTRAMUSCULAR | Status: DC | PRN
  Administered 2014-07-08: 4 mg via INTRAVENOUS

## 2014-07-08 MED ORDER — LIDOCAINE HCL (CARDIAC) 20 MG/ML IV SOLN
INTRAVENOUS | Status: AC
  Filled 2014-07-08: qty 5

## 2014-07-08 MED ORDER — KETOROLAC TROMETHAMINE 30 MG/ML IJ SOLN
30.0000 mg | Freq: Once | INTRAMUSCULAR | Status: AC | PRN
  Administered 2014-07-08: 30 mg via INTRAVENOUS

## 2014-07-08 SURGICAL SUPPLY — 41 items
BAG SPEC RTRVL LRG 6X4 10 (ENDOMECHANICALS)
BENZOIN TINCTURE PRP APPL 2/3 (GAUZE/BANDAGES/DRESSINGS) ×3 IMPLANT
CANISTER SUCTION 2500CC (MISCELLANEOUS) ×3 IMPLANT
CHLORAPREP W/TINT 26ML (MISCELLANEOUS) ×3 IMPLANT
CLIP LIGATING HEMO O LOK GREEN (MISCELLANEOUS) ×3 IMPLANT
COVER MAYO STAND STRL (DRAPES) IMPLANT
COVER SURGICAL LIGHT HANDLE (MISCELLANEOUS) ×3 IMPLANT
COVER TRANSDUCER ULTRASND (DRAPES) ×3 IMPLANT
DEVICE TROCAR PUNCTURE CLOSURE (ENDOMECHANICALS) ×3 IMPLANT
DISSECTOR BLUNT TIP ENDO 5MM (MISCELLANEOUS) ×3 IMPLANT
DRAPE C-ARM 42X72 X-RAY (DRAPES) IMPLANT
DRAPE LAPAROSCOPIC ABDOMINAL (DRAPES) ×3 IMPLANT
DRAPE UTILITY XL STRL (DRAPES) ×6 IMPLANT
ELECT REM PT RETURN 9FT ADLT (ELECTROSURGICAL) ×3
ELECTRODE REM PT RTRN 9FT ADLT (ELECTROSURGICAL) ×1 IMPLANT
GAUZE SPONGE 2X2 8PLY STRL LF (GAUZE/BANDAGES/DRESSINGS) ×1 IMPLANT
GLOVE BIO SURGEON STRL SZ7.5 (GLOVE) ×3 IMPLANT
GOWN STRL REUS W/ TWL LRG LVL3 (GOWN DISPOSABLE) ×2 IMPLANT
GOWN STRL REUS W/ TWL XL LVL3 (GOWN DISPOSABLE) ×1 IMPLANT
GOWN STRL REUS W/TWL LRG LVL3 (GOWN DISPOSABLE) ×4
GOWN STRL REUS W/TWL XL LVL3 (GOWN DISPOSABLE) ×2
IV CATH 14GX2 1/4 (CATHETERS) IMPLANT
KIT BASIN OR (CUSTOM PROCEDURE TRAY) ×3 IMPLANT
KIT ROOM TURNOVER OR (KITS) ×3 IMPLANT
NEEDLE INSUFFLATION 14GA 120MM (NEEDLE) ×3 IMPLANT
NS IRRIG 1000ML POUR BTL (IV SOLUTION) ×3 IMPLANT
PAD ARMBOARD 7.5X6 YLW CONV (MISCELLANEOUS) ×6 IMPLANT
POUCH SPECIMEN RETRIEVAL 10MM (ENDOMECHANICALS) IMPLANT
SCISSORS LAP 5X35 DISP (ENDOMECHANICALS) ×3 IMPLANT
SET CHOLANGIOGRAPHY FRANKLIN (SET/KITS/TRAYS/PACK) IMPLANT
SET IRRIG TUBING LAPAROSCOPIC (IRRIGATION / IRRIGATOR) ×3 IMPLANT
SLEEVE ENDOPATH XCEL 5M (ENDOMECHANICALS) ×3 IMPLANT
SPECIMEN JAR SMALL (MISCELLANEOUS) ×3 IMPLANT
SPONGE GAUZE 2X2 STER 10/PKG (GAUZE/BANDAGES/DRESSINGS) ×2
SUT MNCRL AB 3-0 PS2 18 (SUTURE) ×3 IMPLANT
TOWEL OR 17X24 6PK STRL BLUE (TOWEL DISPOSABLE) ×3 IMPLANT
TOWEL OR 17X26 10 PK STRL BLUE (TOWEL DISPOSABLE) ×3 IMPLANT
TRAY LAPAROSCOPIC (CUSTOM PROCEDURE TRAY) ×3 IMPLANT
TROCAR XCEL NON-BLD 11X100MML (ENDOMECHANICALS) ×3 IMPLANT
TROCAR XCEL NON-BLD 5MMX100MML (ENDOMECHANICALS) ×3 IMPLANT
TUBING INSUFFLATION (TUBING) ×3 IMPLANT

## 2014-07-08 NOTE — Anesthesia Postprocedure Evaluation (Signed)
  Anesthesia Post-op Note  Patient: Trevor Evans  Procedure(s) Performed: Procedure(s) (LRB): LAPAROSCOPIC CHOLECYSTECTOMY (N/A)  Patient Location: PACU  Anesthesia Type: General  Level of Consciousness: awake and alert   Airway and Oxygen Therapy: Patient Spontanous Breathing  Post-op Pain: mild  Post-op Assessment: Post-op Vital signs reviewed, Patient's Cardiovascular Status Stable, Respiratory Function Stable, Patent Airway and No signs of Nausea or vomiting  Last Vitals:  Filed Vitals:   07/08/14 0900  BP: 153/80  Pulse: 77  Temp:   Resp: 18    Post-op Vital Signs: stable   Complications: No apparent anesthesia complications

## 2014-07-08 NOTE — Anesthesia Procedure Notes (Signed)
Procedure Name: Intubation Date/Time: 07/08/2014 7:35 AM Performed by: Kyung Rudd Pre-anesthesia Checklist: Patient identified, Emergency Drugs available, Suction available, Patient being monitored and Timeout performed Patient Re-evaluated:Patient Re-evaluated prior to inductionOxygen Delivery Method: Circle system utilized Preoxygenation: Pre-oxygenation with 100% oxygen Intubation Type: IV induction and Rapid sequence Ventilation: Mask ventilation without difficulty Laryngoscope Size: Mac and 3 Grade View: Grade I Tube type: Oral Tube size: 7.5 mm Number of attempts: 1 Airway Equipment and Method: Stylet Placement Confirmation: ETT inserted through vocal cords under direct vision,  positive ETCO2 and breath sounds checked- equal and bilateral Secured at: 23 cm Tube secured with: Tape Dental Injury: Teeth and Oropharynx as per pre-operative assessment

## 2014-07-08 NOTE — Progress Notes (Signed)
Report given to jamie rn as caregiver 

## 2014-07-08 NOTE — Transfer of Care (Signed)
Immediate Anesthesia Transfer of Care Note  Patient: Trevor Evans  Procedure(s) Performed: Procedure(s): LAPAROSCOPIC CHOLECYSTECTOMY (N/A)  Patient Location: PACU  Anesthesia Type:General  Level of Consciousness: awake, alert  and oriented  Airway & Oxygen Therapy: Patient Spontanous Breathing and Patient connected to face mask oxygen  Post-op Assessment: Report given to PACU RN, Post -op Vital signs reviewed and stable and Patient moving all extremities X 4  Post vital signs: Reviewed and stable  Complications: No apparent anesthesia complications

## 2014-07-08 NOTE — H&P (Signed)
History of Present Illness Ralene Ok MD; 06/23/2014 4:46 PM) Patient words: Evaluate gallbladder.  The patient is a 63 year old male who presents for evaluation of gall stones. Patient is a 63 year old male who is referred by a doctor Kohut for evaluation of gallstone. Patient states that approximately 2 weeks ago he woke up with epigastric abdominal pain. He follow-up with his physician which ordered ultrasound. Ultrasound revealed a large 3 cm stone at the neck of the gallbladder subacutecholecystitis.Patientstateshehasnothadanyrecurrentpainsincetheinitialonset. LFTs that we have in clinic are within normal limits recently. He did have an elevated AST/ALT with a normal T bili   Other Problems Ivor Costa, [place]; 06/23/2014 4:18 PM) Back Pain Chest pain Other disease, cancer, significant illness Sleep Apnea  Past Surgical History Ivor Costa, [place]; 06/23/2014 4:18 PM) Tonsillectomy  Diagnostic Studies History Ivor Costa, [place]; 06/23/2014 4:18 PM) Colonoscopy 5-10 years ago  Allergies Ivor [place], [place]; 06/23/2014 4:18 PM) No Known Drug Allergies12/08/2013  Medication History Ivor Costa, [place]; 06/23/2014 4:24 PM) Aspirin (325MG  Tablet, Oral) Active. Hydrocodone-Acetaminophen (5-325MG  Tablet, Oral) Active. Multiple Vitamin (Oral) Active. Naproxen (500MG  Tablet, Oral) Active. Medications Reconciled  Social History Alyse Low [place], [place]; 06/23/2014 4:18 PM) Alcohol use Remotely quit alcohol use. Caffeine use Carbonated beverages, Tea. No drug use Tobacco use Never smoker.  Family History Ivor Costa, [place]; 06/23/2014 4:18 PM) Alcohol Abuse Father. Diabetes Mellitus Family Members In General. Hypertension Mother. Migraine Headache Mother. Respiratory Condition Father.  Review of Systems Alyse Low [place] MA; 06/23/2014 4:18 PM) General Not Present- Appetite Loss, Chills, Fatigue, Fever, Night Sweats, Weight Gain and Weight Loss. Skin Not Present- Change  in Wart/Mole, Dryness, Hives, Jaundice, New Lesions, Non-Healing Wounds, Rash and Ulcer. HEENT Present- Wears glasses/contact lenses. Not Present- Earache, Hearing Loss, Hoarseness, Nose Bleed, Oral Ulcers, Ringing in the Ears, Seasonal Allergies, Sinus Pain, Sore Throat, Visual Disturbances and Yellow Eyes. Respiratory Not Present- Bloody sputum, Chronic Cough, Difficulty Breathing, Snoring and Wheezing. Breast Not Present- Breast Mass, Breast Pain, Nipple Discharge and Skin Changes. Cardiovascular Not Present- Chest Pain, Difficulty Breathing Lying Down, Leg Cramps, Palpitations, Rapid Heart Rate, Shortness of Breath and Swelling of Extremities. Gastrointestinal Present- Constipation. Not Present- Abdominal Pain, Bloating, Bloody Stool, Change in Bowel Habits, Chronic diarrhea, Difficulty Swallowing, Excessive gas, Gets full quickly at meals, Hemorrhoids, Indigestion, Nausea, Rectal Pain and Vomiting. Male Genitourinary Not Present- Blood in Urine, Change in Urinary Stream, Frequency, Impotence, Nocturia, Painful Urination, Urgency and Urine Leakage. Musculoskeletal Present- Back Pain. Not Present- Joint Pain, Joint Stiffness, Muscle Pain, Muscle Weakness and Swelling of Extremities. Neurological Not Present- Decreased Memory, Fainting, Headaches, Numbness, Seizures, Tingling, Tremor, Trouble walking and Weakness. Psychiatric Not Present- Anxiety, Bipolar, Change in Sleep Pattern, Depression, Fearful and Frequent crying. Endocrine Not Present- Cold Intolerance, Excessive Hunger, Hair Changes, Heat Intolerance, Hot flashes and New Diabetes. Hematology Not Present- Easy Bruising, Excessive bleeding, Gland problems, HIV and Persistent Infections.   Vitals Ivor Costa MA; 06/23/2014 4:18 PM) 06/23/2014 4:17 PM Weight: 286 lb Temp.: 97.60F(Temporal)  Pulse: 71 (Regular)  Resp.: 18 (Unlabored)  BP: 140/82 (Sitting, Left Arm, Standard)    Physical Exam Ralene Ok MD; 06/23/2014 4:47  PM) General Mental Status-Alert. General Appearance-Consistent with stated age. Hydration-Well hydrated. Voice-Normal.  Head and Neck Head-normocephalic, atraumatic with no lesions or palpable masses.  Chest and Lung Exam Chest and lung exam reveals -quiet, even and easy respiratory effort with no use of accessory muscles and on auscultation, normal breath sounds, no adventitious sounds and normal vocal resonance. Inspection Chest Wall - Normal. Back -  normal.  Cardiovascular Cardiovascular examination reveals -on palpation PMI is normal in location and amplitude, no palpable S3 or S4. Normal cardiac borders., normal heart sounds, regular rate and rhythm with no murmurs, carotid auscultation reveals no bruits and normal pedal pulses bilaterally.  Abdomen Inspection Inspection of the abdomen reveals - No Hernias. Skin - Scar - no surgical scars. Palpation/Percussion Palpation and Percussion of the abdomen reveal - Soft, Non Tender, No Rebound tenderness, No Rigidity (guarding) and No hepatosplenomegaly. Auscultation Auscultation of the abdomen reveals - Bowel sounds normal.  Neurologic Neurologic evaluation reveals -alert and oriented x 3 with no impairment of recent or remote memory. Mental Status-Normal.  Musculoskeletal Normal Exam - Left-Upper Extremity Strength Normal and Lower Extremity Strength Normal. Normal Exam - Right-Upper Extremity Strength Normal, Lower Extremity Weakness.    Assessment & Plan Ralene Ok MD; 06/23/2014 4:48 PM) GALLSTONES (574.20  K80.20) Impression: 63 year old male with symptomatically cholelithiasis 1. Will proceed to plan for laparoscopic cholecystectomy 2. Risks and benefits were discussed with the patient to generally include, but not limited to: infection, bleeding, possible need for post op ERCP, damage to the bile ducts, bile leak, and possible need for further surgery. Alternatives were offered and described.  All questions were answered and the patient voiced understanding of the procedure and wishes to proceed at this point with a laparoscopic cholecystectomy

## 2014-07-08 NOTE — Anesthesia Preprocedure Evaluation (Addendum)
Anesthesia Evaluation  Patient identified by MRN, date of birth, ID band Patient awake    Reviewed: Allergy & Precautions, H&P , NPO status , Patient's Chart, lab work & pertinent test results  Airway Mallampati: III  TM Distance: <3 FB Neck ROM: Full    Dental no notable dental hx. (+) Teeth Intact   Pulmonary sleep apnea , former smoker,  breath sounds clear to auscultation  Pulmonary exam normal       Cardiovascular negative cardio ROS  Rhythm:Regular Rate:Normal     Neuro/Psych negative neurological ROS  negative psych ROS   GI/Hepatic negative GI ROS, Neg liver ROS, GERD-  ,  Endo/Other  diabetesMorbid obesity  Renal/GU negative Renal ROS  negative genitourinary   Musculoskeletal negative musculoskeletal ROS (+)   Abdominal   Peds negative pediatric ROS (+)  Hematology negative hematology ROS (+)   Anesthesia Other Findings   Reproductive/Obstetrics negative OB ROS                            Anesthesia Physical Anesthesia Plan  ASA: III  Anesthesia Plan: General   Post-op Pain Management:    Induction: Intravenous  Airway Management Planned: Oral ETT  Additional Equipment:   Intra-op Plan:   Post-operative Plan: Extubation in OR  Informed Consent: I have reviewed the patients History and Physical, chart, labs and discussed the procedure including the risks, benefits and alternatives for the proposed anesthesia with the patient or authorized representative who has indicated his/her understanding and acceptance.   Dental advisory given  Plan Discussed with: CRNA and Surgeon  Anesthesia Plan Comments:         Anesthesia Quick Evaluation

## 2014-07-08 NOTE — Discharge Instructions (Signed)
[facility] ______[facility] °LAPAROSCOPIC SURGERY: POST OP INSTRUCTIONS °Always review your discharge instruction sheet given to you by the facility where your surgery was performed. °IF YOU HAVE DISABILITY OR FAMILY LEAVE FORMS, YOU MUST BRING THEM TO THE OFFICE FOR PROCESSING.   °DO NOT GIVE THEM TO YOUR DOCTOR. ° °1. A prescription for pain medication may be given to you upon discharge.  Take your pain medication as prescribed, if needed.  If narcotic pain medicine is not needed, then you may take acetaminophen (Tylenol) or ibuprofen (Advil) as needed. °2. Take your usually prescribed medications unless otherwise directed. °3. If you need a refill on your pain medication, please contact your pharmacy.  They will contact our office to request authorization. Prescriptions will not be filled after 5pm or on week-ends. °4. You should follow a light diet the first few days after arrival home, such as soup and crackers, etc.  Be sure to include lots of fluids daily. °5. Most patients will experience some swelling and bruising in the area of the incisions.  Ice packs will help.  Swelling and bruising can take several days to resolve.  °6. It is common to experience some constipation if taking pain medication after surgery.  Increasing fluid intake and taking a stool softener (such as Colace) will usually help or prevent this problem from occurring.  A mild laxative (Milk of Magnesia or Miralax) should be taken according to package instructions if there are no bowel movements after 48 hours. °7. Unless discharge instructions indicate otherwise, you may remove your bandages 24-48 hours after surgery, and you may shower at that time.  You may have steri-strips (small skin tapes) in place directly over the incision.  These strips should be left on the skin for 7-10 days.  If your surgeon used skin glue on the incision, you may shower in 24 hours.  The glue will flake off over the next 2-3 weeks.  Any sutures or  staples will be removed at the office during your follow-up visit. °8. ACTIVITIES:  You may resume regular (light) daily activities beginning the next day--such as daily self-care, walking, climbing stairs--gradually increasing activities as tolerated.  You may have sexual intercourse when it is comfortable.  Refrain from any heavy lifting or straining until approved by your doctor. °a. You may drive when you are no longer taking prescription pain medication, you can comfortably wear a seatbelt, and you can safely maneuver your car and apply brakes. °b. RETURN TO WORK:  __________________________________________________________ °9. You should see your doctor in the office for a follow-up appointment approximately 2-3 weeks after your surgery.  Make sure that you call for this appointment within a day or two after you arrive home to insure a convenient appointment time. °10. OTHER INSTRUCTIONS: __________________________________________________________________________________________________________________________ __________________________________________________________________________________________________________________________ °WHEN TO CALL YOUR DOCTOR: °1. Fever over 101.0 °2. Inability to urinate °3. Continued bleeding from incision. °4. Increased pain, redness, or drainage from the incision. °5. Increasing abdominal pain ° °The clinic staff is available to answer your questions during regular business hours.  Please don’t hesitate to call and ask to speak to one of the nurses for clinical concerns.  If you have a medical emergency, go to the nearest emergency room or call 911.  A surgeon from [facility] is always on call at the hospital. °[address], [address], [place], [place]  [address] ? [address] [address], [place],    [address] °[phone] ? [phone] ? FAX [phone] °Web site:   [email or web address]   General Anesthesia, Adult, Care After  Refer to this  sheet in the next few weeks. These instructions provide you with information on caring for yourself after your procedure. Your health care provider may also give you more specific instructions. Your treatment has been planned according to current medical practices, but problems sometimes occur. Call your health care provider if you have any problems or questions after your procedure.  WHAT TO EXPECT AFTER THE PROCEDURE  After the procedure, it is typical to experience:  Sleepiness.  Nausea and vomiting. HOME CARE INSTRUCTIONS  For the first 24 hours after general anesthesia:  Have a responsible person with you.  Do not drive a car. If you are alone, do not take public transportation.  Do not drink alcohol.  Do not take medicine that has not been prescribed by your health care provider.  Do not sign important papers or make important decisions.  You may resume a normal diet and activities as directed by your health care provider.  Change bandages (dressings) as directed.  If you have questions or problems that seem related to general anesthesia, call the hospital and ask for the anesthetist or anesthesiologist on call. SEEK MEDICAL CARE IF:  You have nausea and vomiting that continue the day after anesthesia.  You develop a rash. SEEK IMMEDIATE MEDICAL CARE IF:  You have difficulty breathing.  You have chest pain.  You have any allergic problems. Document Released: 10/15/2000 Document Revised: 03/11/2013 Document Reviewed: 01/22/2013  [facility] Patient Information 2014 [place], [place].   What to eat:  For your first meals, you should eat lightly; only small meals initially.  If you do not have nausea, you may eat larger meals.  Avoid spicy, greasy and heavy food.    General Anesthesia, Adult, Care After  Refer to this sheet in the next few weeks. These instructions provide you with information on caring for yourself after your procedure. Your health care provider may also give you more  specific instructions. Your treatment has been planned according to current medical practices, but problems sometimes occur. Call your health care provider if you have any problems or questions after your procedure.  WHAT TO EXPECT AFTER THE PROCEDURE  After the procedure, it is typical to experience:  Sleepiness.  Nausea and vomiting. HOME CARE INSTRUCTIONS  For the first 24 hours after general anesthesia:  Have a responsible person with you.  Do not drive a car. If you are alone, do not take public transportation.  Do not drink alcohol.  Do not take medicine that has not been prescribed by your health care provider.  Do not sign important papers or make important decisions.  You may resume a normal diet and activities as directed by your health care provider.  Change bandages (dressings) as directed.  If you have questions or problems that seem related to general anesthesia, call the hospital and ask for the anesthetist or anesthesiologist on call. SEEK MEDICAL CARE IF:  You have nausea and vomiting that continue the day after anesthesia.  You develop a rash. SEEK IMMEDIATE MEDICAL CARE IF:  You have difficulty breathing.  You have chest pain.  You have any allergic problems. Document Released: 10/15/2000 Document Revised: 03/11/2013 Document Reviewed: 01/22/2013  [facility] Patient Information 2014 [place], [place].

## 2014-07-08 NOTE — Op Note (Signed)
07/08/2014  8:22 AM  PATIENT:  Trevor Evans  63 y.o. male  PRE-OPERATIVE DIAGNOSIS:  gallstones  POST-OPERATIVE DIAGNOSIS:  same  PROCEDURE:  Procedure(s): LAPAROSCOPIC CHOLECYSTECTOMY (N/A)  SURGEON:  Surgeon(s) and Role:    * Ralene Ok, MD - Primary  ASSISTANTS: none   ANESTHESIA:   local and general  EBL:     BLOOD ADMINISTERED:none  DRAINS: none   LOCAL MEDICATIONS USED:  BUPIVICAINE   SPECIMEN:  Source of Specimen:  gallbladder  DISPOSITION OF SPECIMEN:  PATHOLOGY  COUNTS:  YES  TOURNIQUET:  * No tourniquets in log *  DICTATION: .Dragon Dictation  Findings: The patient had chronic cholecystitis and gallstones  Indications for procedure: Pt is a 63 y/o M with RUQ pain and seen to have gallstones.  Pt with pain and decided to have his gallbladder removed.  Details of the procedure:  The patient was taken to the operating and placed in the supine position with bilateral SCDs in place.  The patient was prepped and draped in the usual sterile fashion. A time out was called and all facts were verified. A pneumoperitoneum was obtained via A Veress needle technique to a pressure of 66mm of mercury. A 41mm trochar was then placed in the right upper quadrant under visualization, and there were no injuries to any abdominal organs. A 11 mm port was then placed in the umbilical region after infiltrating with local anesthesia under direct visualization. A second and third epigastric port and right lower quadrant port placement under direct visualization, respectively. The gallbladder was identified and retracted, the peritoneum was then sharply dissected from the gallbladder and this dissection was carried down to Calot's triangle. The gallbladder was identified and stripped away circumferentially and seen going into the gallbladder 360, the critical angle was obtained.   2 clips were placed proximally one distally and the cystic duct transected. The cystic artery was  identified and 2 clips placed proximally and one distally and transected.  We then proceeded to remove the gallbladder off the hepatic fossa with Bovie cautery. A retreival bag was then placed in the abdomen and gallbladder placed in the bag. The hepatic fossa was then reexamined and hemostasis was achieved with Bovie cautery and was excellent at the end of the case. The subhepatic fossa and perihepatic fossa was then irrigated until the effluent was clear. The specimena dn bag were removed from the umbilical port site. The 11 mm trocar fascia was reapproximated with the Endo Close #1 Vicryl x2.  The pneumoperitoneum was evacuated and all trochars removed under direct visulalization.  The skin was then closed with 4-0 Monocryl and the skin dressed with Steri-Strips, gauze, and tape.  The patient was awaken from general anesthesia and taken to the recovery room in stable condition.   PLAN OF CARE: Discharge to home after PACU  PATIENT DISPOSITION:  PACU - hemodynamically stable.   Delay start of Pharmacological VTE agent (>24hrs) due to surgical blood loss or risk of bleeding: not applicable

## 2014-07-09 ENCOUNTER — Encounter (HOSPITAL_COMMUNITY): Payer: Self-pay | Admitting: General Surgery

## 2016-02-07 ENCOUNTER — Encounter: Payer: Self-pay | Admitting: Internal Medicine

## 2016-02-13 ENCOUNTER — Ambulatory Visit (AMBULATORY_SURGERY_CENTER): Payer: Self-pay | Admitting: *Deleted

## 2016-02-13 VITALS — Ht 69.0 in | Wt 269.0 lb

## 2016-02-13 DIAGNOSIS — Z1211 Encounter for screening for malignant neoplasm of colon: Secondary | ICD-10-CM

## 2016-02-13 MED ORDER — NA SULFATE-K SULFATE-MG SULF 17.5-3.13-1.6 GM/177ML PO SOLN: ORAL | 0 refills | Status: DC

## 2016-02-23 ENCOUNTER — Encounter: Payer: BC Managed Care – PPO | Admitting: Internal Medicine

## 2016-02-29 ENCOUNTER — Encounter: Payer: Self-pay | Admitting: Internal Medicine

## 2016-03-14 ENCOUNTER — Encounter: Payer: BC Managed Care – PPO | Admitting: Internal Medicine

## 2016-03-15 ENCOUNTER — Encounter: Payer: BC Managed Care – PPO | Admitting: Internal Medicine

## 2016-03-19 ENCOUNTER — Encounter: Payer: Self-pay | Admitting: Internal Medicine

## 2016-05-29 ENCOUNTER — Telehealth: Payer: Self-pay | Admitting: *Deleted

## 2016-05-29 NOTE — Telephone Encounter (Signed)
Left message for pt to call and reschedule PV by 5:00 today; if not rescheduled by 5:00 colonoscopy will be cancelled

## 2016-05-29 NOTE — Telephone Encounter (Signed)
Pt did not call to reschedule pv.  PV and colonoscopy cancelled. No show letter sent to pt

## 2016-05-30 ENCOUNTER — Encounter: Payer: Self-pay | Admitting: Endocrinology

## 2016-06-12 ENCOUNTER — Encounter: Payer: BC Managed Care – PPO | Admitting: Internal Medicine

## 2017-01-21 ENCOUNTER — Encounter: Payer: Self-pay | Admitting: Internal Medicine

## 2017-03-26 ENCOUNTER — Encounter: Payer: Self-pay | Admitting: Internal Medicine

## 2017-03-26 ENCOUNTER — Ambulatory Visit (AMBULATORY_SURGERY_CENTER): Payer: Self-pay

## 2017-03-26 VITALS — Ht 68.0 in | Wt 283.6 lb

## 2017-03-26 DIAGNOSIS — Z1211 Encounter for screening for malignant neoplasm of colon: Secondary | ICD-10-CM

## 2017-03-26 MED ORDER — NA SULFATE-K SULFATE-MG SULF 17.5-3.13-1.6 GM/177ML PO SOLN: ORAL | 0 refills | Status: DC

## 2017-03-26 NOTE — Progress Notes (Signed)
Per pt, no allergies to soy or egg products.Pt not taking any weight loss meds or using  O2 at home.   Pt refused Emmi video. 

## 2017-04-08 ENCOUNTER — Encounter: Payer: Self-pay | Admitting: Internal Medicine

## 2017-04-08 ENCOUNTER — Ambulatory Visit (AMBULATORY_SURGERY_CENTER): Payer: BC Managed Care – PPO | Admitting: Internal Medicine

## 2017-04-08 VITALS — BP 122/59 | HR 65 | Temp 96.0°F | Resp 13 | Ht 68.0 in | Wt 283.0 lb

## 2017-04-08 DIAGNOSIS — D123 Benign neoplasm of transverse colon: Secondary | ICD-10-CM | POA: Diagnosis not present

## 2017-04-08 DIAGNOSIS — D120 Benign neoplasm of cecum: Secondary | ICD-10-CM

## 2017-04-08 DIAGNOSIS — Z1211 Encounter for screening for malignant neoplasm of colon: Secondary | ICD-10-CM

## 2017-04-08 MED ORDER — SODIUM CHLORIDE 0.9 % IV SOLN: 500.0000 mL | INTRAVENOUS | Status: AC

## 2017-04-08 NOTE — Op Note (Signed)
[facility] Patient Name: Trevor Evans Procedure Date: 04/08/2017 8:30 AM MRN: [id number] Endoscopist: Jerene Bears , MD Age: 66 Referring MD:  Date of Birth: 1950-09-09 Gender: Male Account #: [id number] Procedure:                Colonoscopy Indications:              Screening for colorectal malignant neoplasm, Last                            colonoscopy: 2006 Medicines:                Monitored Anesthesia Care Procedure:                Pre-Anesthesia Assessment:                           - Prior to the procedure, a History and Physical                            was performed, and patient medications and                            allergies were reviewed. The patient's tolerance of                            previous anesthesia was also reviewed. The risks                            and benefits of the procedure and the sedation                            options and risks were discussed with the patient.                            All questions were answered, and informed consent                            was obtained. Prior Anticoagulants: The patient has                            taken no previous anticoagulant or antiplatelet                            agents. ASA Grade Assessment: III - A patient with                            severe systemic disease. After reviewing the risks                            and benefits, the patient was deemed in                            satisfactory condition to undergo the procedure.  After obtaining informed consent, the colonoscope                            was passed under direct vision. Throughout the                            procedure, the patient's blood pressure, pulse, and                            oxygen saturations were monitored continuously. The                            Colonoscope was introduced through the anus and                            advanced to the the cecum, identified by                           appendiceal orifice and ileocecal valve. The                            colonoscopy was performed without difficulty. The                            patient tolerated the procedure well. The quality                            of the bowel preparation was good. The ileocecal                            valve, appendiceal orifice, and rectum were                            photographed. Scope In: 8:49:32 AM Scope Out: 9:03:16 AM Scope Withdrawal Time: 0 hours 10 minutes 49 seconds  Total Procedure Duration: 0 hours 13 minutes 44 seconds  Findings:                 The digital rectal exam was normal.                           Two sessile polyps were found in the cecum. The                            polyps were 4 to 8 mm in size. These polyps were                            removed with a cold snare. Resection and retrieval                            were complete.                           A 4 mm polyp was found in the hepatic flexure. The  polyp was sessile. The polyp was removed with a                            cold snare. Resection and retrieval were complete.                           Multiple small and large-mouthed diverticula were                            found in the sigmoid colon, descending colon and                            hepatic flexure.                           Internal hemorrhoids were found during                            retroflexion. The hemorrhoids were small. Complications:            No immediate complications. Estimated Blood Loss:     Estimated blood loss was minimal. Impression:               - Two 4 to 8 mm polyps in the cecum, removed with a                            cold snare. Resected and retrieved.                           - One 4 mm polyp at the hepatic flexure, removed                            with a cold snare. Resected and retrieved.                           - Moderate diverticulosis in the  sigmoid colon, in                            the descending colon and at the hepatic flexure.                           - Internal hemorrhoids. Recommendation:           - Patient has a contact number available for                            emergencies. The signs and symptoms of potential                            delayed complications were discussed with the                            patient. Return to normal activities tomorrow.  Written discharge instructions were provided to the                            patient.                           - Resume previous diet.                           - Continue present medications.                           - Await pathology results.                           - Repeat colonoscopy is recommended for                            surveillance. The colonoscopy date will be                            determined after pathology results from today's                            exam become available for review. Jerene Bears, MD 04/08/2017 9:07:31 AM This report has been signed electronically.

## 2017-04-08 NOTE — Progress Notes (Signed)
Pt's states no medical or surgical changes since previsit or office visit. 

## 2017-04-08 NOTE — Progress Notes (Signed)
Called to room to assist during endoscopic procedure.  Patient ID and intended procedure confirmed with present staff. Received instructions for my participation in the procedure from the performing physician.  

## 2017-04-08 NOTE — Patient Instructions (Signed)
Impression/Recommendations:  Polyp handout given to patient. Hemorrhoid handout given to patient. Diverticulosis handout given to patient.  Resume previous diet. Continue present medications. Await pathology results.  Repeat colonoscopy is recommended for surveillance.  Date to be determined after pathology results reviewed.  YOU HAD AN ENDOSCOPIC PROCEDURE TODAY AT [facility]:   Refer to the procedure report that was given to you for any specific questions about what was found during the examination.  If the procedure report does not answer your questions, please call your gastroenterologist to clarify.  If you requested that your care partner not be given the details of your procedure findings, then the procedure report has been included in a sealed envelope for you to review at your convenience later.  YOU SHOULD EXPECT: Some feelings of bloating in the abdomen. Passage of more gas than usual.  Walking can help get rid of the air that was put into your GI tract during the procedure and reduce the bloating. If you had a lower endoscopy (such as a colonoscopy or flexible sigmoidoscopy) you may notice spotting of blood in your stool or on the toilet paper. If you underwent a bowel prep for your procedure, you may not have a normal bowel movement for a few days.  Please Note:  You might notice some irritation and congestion in your nose or some drainage.  This is from the oxygen used during your procedure.  There is no need for concern and it should clear up in a day or so.  SYMPTOMS TO REPORT IMMEDIATELY:   Following lower endoscopy (colonoscopy or flexible sigmoidoscopy):  Excessive amounts of blood in the stool  Significant tenderness or worsening of abdominal pains  Swelling of the abdomen that is new, acute  Fever of 100F or higher For urgent or emergent issues, a gastroenterologist can be reached at any hour by calling [phone].   DIET:  We do recommend a  small meal at first, but then you may proceed to your regular diet.  Drink plenty of fluids but you should avoid alcoholic beverages for 24 hours.  ACTIVITY:  You should plan to take it easy for the rest of today and you should NOT DRIVE or use heavy machinery until tomorrow (because of the sedation medicines used during the test).    FOLLOW UP: Our staff will call the number listed on your records the next business day following your procedure to check on you and address any questions or concerns that you may have regarding the information given to you following your procedure. If we do not reach you, we will leave a message.  However, if you are feeling well and you are not experiencing any problems, there is no need to return our call.  We will assume that you have returned to your regular daily activities without incident.  If any biopsies were taken you will be contacted by phone or by letter within the next 1-3 weeks.  Please call us at [phone] if you have not heard about the biopsies in 3 weeks.    SIGNATURES/CONFIDENTIALITY: You and/or your care partner have signed paperwork which will be entered into your electronic medical record.  These signatures attest to the fact that that the information above on your After Visit Summary has been reviewed and is understood.  Full responsibility of the confidentiality of this discharge information lies with you and/or your care-partner.

## 2017-04-08 NOTE — Progress Notes (Signed)
Report given to PACU, vss 

## 2017-04-09 ENCOUNTER — Telehealth: Payer: Self-pay

## 2017-04-09 NOTE — Telephone Encounter (Signed)
Called [phone] and left a messaged we tried to reach pt for a follow up call. maw

## 2017-04-09 NOTE — Telephone Encounter (Signed)
  Follow up Call-  Call back number 04/08/2017  Post procedure Call Back phone  # [phone]  Permission to leave phone message Yes  Some recent data might be hidden     Patient questions:  Do you have a fever, pain , or abdominal swelling? No. Pain Score  0 *  Have you tolerated food without any problems? Yes.    Have you been able to return to your normal activities? Yes.    Do you have any questions about your discharge instructions: Diet   No. Medications  No. Follow up visit  No.  Do you have questions or concerns about your Care? No.  Actions: * If pain score is 4 or above: No action needed, pain <4.

## 2017-04-15 ENCOUNTER — Encounter: Payer: Self-pay | Admitting: Internal Medicine

## 2018-05-15 ENCOUNTER — Other Ambulatory Visit: Payer: Self-pay | Admitting: Internal Medicine

## 2018-05-15 DIAGNOSIS — R29898 Other symptoms and signs involving the musculoskeletal system: Secondary | ICD-10-CM

## 2018-05-15 DIAGNOSIS — M5416 Radiculopathy, lumbar region: Secondary | ICD-10-CM

## 2018-05-17 ENCOUNTER — Ambulatory Visit
Admission: RE | Admit: 2018-05-17 | Discharge: 2018-05-17 | Disposition: A | Discharge status: Home / Self Care | Payer: BC Managed Care – PPO | Source: Ambulatory Visit | Attending: Internal Medicine | Admitting: Internal Medicine

## 2018-05-17 DIAGNOSIS — M5416 Radiculopathy, lumbar region: Secondary | ICD-10-CM

## 2018-05-17 DIAGNOSIS — R29898 Other symptoms and signs involving the musculoskeletal system: Secondary | ICD-10-CM

## 2018-06-12 ENCOUNTER — Ambulatory Visit (INDEPENDENT_AMBULATORY_CARE_PROVIDER_SITE_OTHER): Payer: Self-pay | Admitting: Specialist

## 2020-02-29 ENCOUNTER — Encounter: Payer: Self-pay | Admitting: Internal Medicine

## 2020-04-29 ENCOUNTER — Encounter: Payer: Self-pay | Admitting: Internal Medicine

## 2020-04-29 ENCOUNTER — Ambulatory Visit (AMBULATORY_SURGERY_CENTER): Payer: Self-pay | Admitting: *Deleted

## 2020-04-29 ENCOUNTER — Other Ambulatory Visit: Payer: Self-pay

## 2020-04-29 VITALS — Ht 67.5 in | Wt 280.0 lb

## 2020-04-29 DIAGNOSIS — Z86010 Personal history of colonic polyps: Secondary | ICD-10-CM

## 2020-04-29 MED ORDER — SUTAB 1479-225-188 MG PO TABS: 24.0000 | ORAL_TABLET | ORAL | 0 refills | Status: DC

## 2020-04-29 NOTE — Progress Notes (Signed)
J and J vax 09-2019   No egg or soy allergy known to patient  No issues with past sedation with any surgeries or procedures no intubation problems in the past  No FH of Malignant Hyperthermia No diet pills per patient No home 02 use per patient  No blood thinners per patient  Pt denies issues with constipation  No A fib or A flutter  EMMI video to pt or via [place] 19 guidelines implemented in PV today with Pt and RN   [employer] given to pt in PV today , Code to Pharmacy   Due to the COVID-19 pandemic we are asking patients to follow these guidelines. Please only bring one care partner. Please be aware that your care partner may wait in the car in the parking lot or if they feel like they will be too hot to wait in the car, they may wait in the lobby on the 4th floor. All care partners are required to wear a mask the entire time (we do not have any that we can provide them), they need to practice social distancing, and we will do a Covid check for all patient's and care partners when you arrive. Also we will check their temperature and your temperature. If the care partner waits in their car they need to stay in the parking lot the entire time and we will call them on their cell phone when the patient is ready for discharge so they can bring the car to the front of the building. Also all patient's will need to wear a mask into building.

## 2020-05-13 ENCOUNTER — Ambulatory Visit (AMBULATORY_SURGERY_CENTER): Payer: BC Managed Care – PPO | Admitting: Internal Medicine

## 2020-05-13 ENCOUNTER — Encounter: Payer: Self-pay | Admitting: Internal Medicine

## 2020-05-13 ENCOUNTER — Other Ambulatory Visit: Payer: Self-pay

## 2020-05-13 VITALS — BP 151/87 | HR 71 | Temp 97.1°F | Resp 18 | Ht 67.0 in | Wt 280.0 lb

## 2020-05-13 DIAGNOSIS — Z86010 Personal history of colonic polyps: Secondary | ICD-10-CM

## 2020-05-13 MED ORDER — SODIUM CHLORIDE 0.9 % IV SOLN: 500.0000 mL | INTRAVENOUS | Status: DC

## 2020-05-13 MED ORDER — SODIUM CHLORIDE 0.9 % IV SOLN: 500.0000 mL | INTRAVENOUS | Status: AC

## 2020-05-13 NOTE — Progress Notes (Signed)
Report to PACU, RN, vss, BBS= Clear.  

## 2020-05-13 NOTE — Op Note (Signed)
[facility] Patient Name: Trevor Evans Procedure Date: 05/13/2020 7:54 AM MRN: [id number] Endoscopist: Jerene Bears , MD Age: 69 Referring MD:  Date of Birth: 06/08/1951 Gender: Male Account #: [id number] Procedure:                Colonoscopy Indications:              High risk colon cancer surveillance: Personal                            history of multiple (3) adenomas, Last colonoscopy:                            September 2018 Medicines:                Monitored Anesthesia Care Procedure:                Pre-Anesthesia Assessment:                           - Prior to the procedure, a History and Physical                            was performed, and patient medications and                            allergies were reviewed. The patient's tolerance of                            previous anesthesia was also reviewed. The risks                            and benefits of the procedure and the sedation                            options and risks were discussed with the patient.                            All questions were answered, and informed consent                            was obtained. Prior Anticoagulants: The patient has                            taken no previous anticoagulant or antiplatelet                            agents except for aspirin. ASA Grade Assessment:                            III - A patient with severe systemic disease. After                            reviewing the risks and benefits, the patient was  deemed in satisfactory condition to undergo the                            procedure.                           After obtaining informed consent, the colonoscope                            was passed under direct vision. Throughout the                            procedure, the patient's blood pressure, pulse, and                            oxygen saturations were monitored continuously. The                             Colonoscope was introduced through the anus and                            advanced to the cecum, identified by appendiceal                            orifice and ileocecal valve. The colonoscopy was                            performed without difficulty. The patient tolerated                            the procedure well. The quality of the bowel                            preparation was good. The ileocecal valve,                            appendiceal orifice, and rectum were photographed. Scope In: 7:56:01 AM Scope Out: 8:10:01 AM Scope Withdrawal Time: 0 hours 11 minutes 28 seconds  Total Procedure Duration: 0 hours 14 minutes 0 seconds  Findings:                 The digital rectal exam was normal.                           Multiple small and large-mouthed diverticula were                            found in the sigmoid colon, descending colon and                            hepatic flexure.                           Internal hemorrhoids were found during  retroflexion. The hemorrhoids were small.                           The exam was otherwise without abnormality. Complications:            No immediate complications. Estimated Blood Loss:     Estimated blood loss: none. Impression:               - Diverticulosis in the sigmoid colon, in the                            descending colon and at the hepatic flexure.                           - Small internal hemorrhoids.                           - The examination was otherwise normal.                           - No specimens collected. Recommendation:           - Patient has a contact number available for                            emergencies. The signs and symptoms of potential                            delayed complications were discussed with the                            patient. Return to normal activities tomorrow.                            Written discharge instructions were provided to the                             patient.                           - Resume previous diet.                           - Continue present medications.                           - Repeat colonoscopy in 5 years for surveillance. Jerene Bears, MD 05/13/2020 8:22:29 AM This report has been signed electronically.

## 2020-05-13 NOTE — Patient Instructions (Signed)
Discharge instructions given. °Handouts on Diverticulosis and Hemorrhoids. °Resume previous medications. °YOU HAD AN ENDOSCOPIC PROCEDURE TODAY AT THE [facility]:   Refer to the procedure report that was given to you for any specific questions about what was found during the examination.  If the procedure report does not answer your questions, please call your gastroenterologist to clarify.  If you requested that your care partner not be given the details of your procedure findings, then the procedure report has been included in a sealed envelope for you to review at your convenience later. ° °YOU SHOULD EXPECT: Some feelings of bloating in the abdomen. Passage of more gas than usual.  Walking can help get rid of the air that was put into your GI tract during the procedure and reduce the bloating. If you had a lower endoscopy (such as a colonoscopy or flexible sigmoidoscopy) you may notice spotting of blood in your stool or on the toilet paper. If you underwent a bowel prep for your procedure, you may not have a normal bowel movement for a few days. ° °Please Note:  You might notice some irritation and congestion in your nose or some drainage.  This is from the oxygen used during your procedure.  There is no need for concern and it should clear up in a day or so. ° °SYMPTOMS TO REPORT IMMEDIATELY: ° °Following lower endoscopy (colonoscopy or flexible sigmoidoscopy): ° Excessive amounts of blood in the stool ° Significant tenderness or worsening of abdominal pains ° Swelling of the abdomen that is new, acute ° Fever of 100°F or higher ° ° °For urgent or emergent issues, a gastroenterologist can be reached at any hour by calling [phone]. °Do not use MyChart messaging for urgent concerns.  ° ° °DIET:  We do recommend a small meal at first, but then you may proceed to your regular diet.  Drink plenty of fluids but you should avoid alcoholic beverages for 24 hours. ° °ACTIVITY:  You should plan to  take it easy for the rest of today and you should NOT DRIVE or use heavy machinery until tomorrow (because of the sedation medicines used during the test).   ° °FOLLOW UP: °Our staff will call the number listed on your records 48-72 hours following your procedure to check on you and address any questions or concerns that you may have regarding the information given to you following your procedure. If we do not reach you, we will leave a message.  We will attempt to reach you two times.  During this call, we will ask if you have developed any symptoms of COVID 19. If you develop any symptoms (ie: fever, flu-like symptoms, shortness of breath, cough etc.) before then, please call [phone].  If you test positive for Covid 19 in the 2 weeks post procedure, please call and report this information to us.   ° °If any biopsies were taken you will be contacted by phone or by letter within the next 1-3 weeks.  Please call us at [phone] if you have not heard about the biopsies in 3 weeks.  ° ° °SIGNATURES/CONFIDENTIALITY: °You and/or your care partner have signed paperwork which will be entered into your electronic medical record.  These signatures attest to the fact that that the information above on your After Visit Summary has been reviewed and is understood.  Full responsibility of the confidentiality of this discharge information lies with you and/or your care-partner.  °

## 2020-05-17 ENCOUNTER — Telehealth: Payer: Self-pay

## 2020-05-17 NOTE — Telephone Encounter (Signed)
First post procedure follow up call, no answer 

## 2020-05-17 NOTE — Telephone Encounter (Signed)
There was no number to call associated to procedure visit.  I called [phone] and the answering machine picked up.  I did not leave a message d/t not sure if pt said it was ok to leave a message.  maw

## 2020-06-03 IMAGING — MR MR LUMBAR SPINE W/O CM
4 of 5 series · 18 of 48 positions shown · non-contrast
Comparison: Lumbar radiographs 12/17/2017.

CLINICAL DATA: 67-year-old male with low back pain radiating to the
left leg with weakness for 3 weeks.

EXAM:
MRI LUMBAR SPINE WITHOUT CONTRAST
TECHNIQUE: Multiplanar, multisequence MR imaging of the lumbar spine was
performed. No intravenous contrast was administered.

[Series 6: T2 · sagittal · 4.0mm · 0.73mm/px · 6 of 15 slices shown (1 of 2)]
[im 1/15]
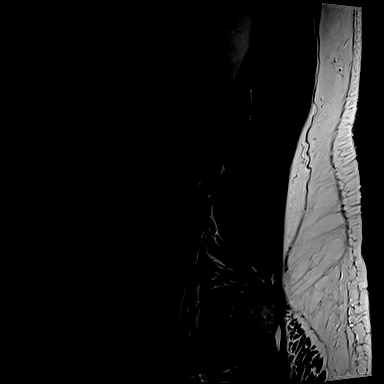
[im 3/15]
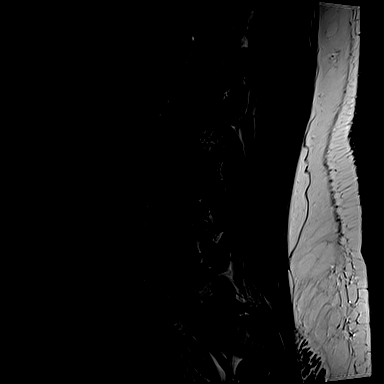
[im 6/15]
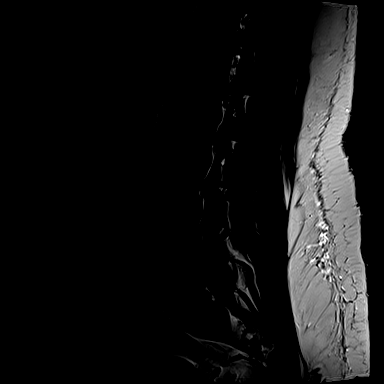
[im 9/15]
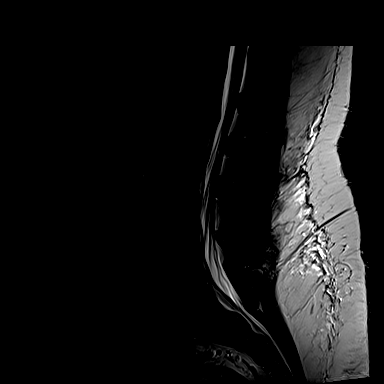
[im 12/15]
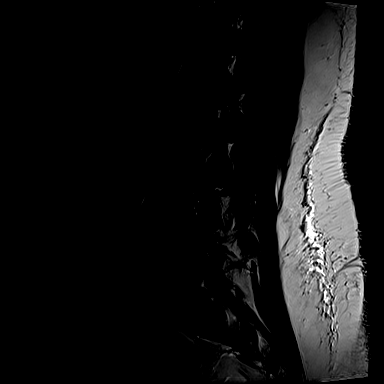
[im 15/15]
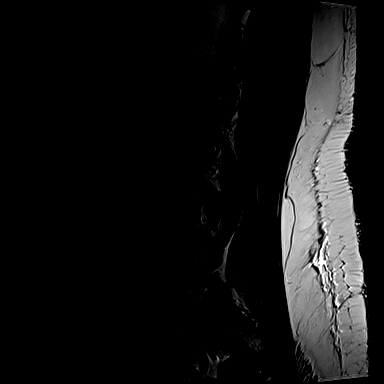

[Series 7: T1 · sagittal · 4.0mm · 0.73mm/px · 3 of 15 slices shown (1 of 2)]
[im 1/15]
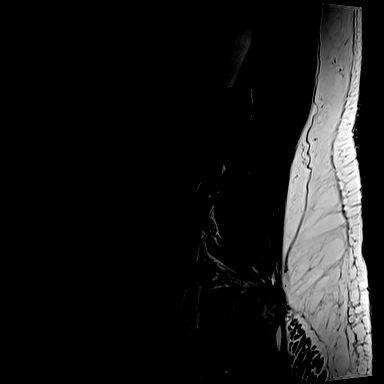
[im 8/15]
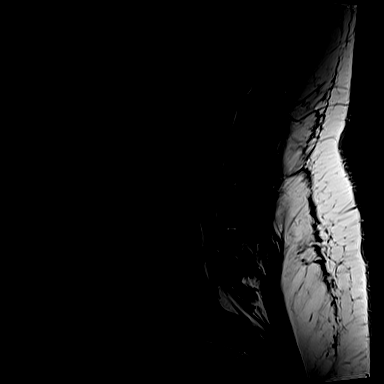
[im 15/15]
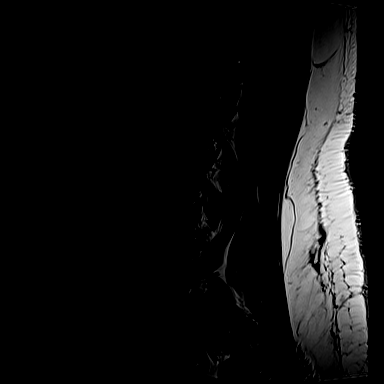

[Series 11: T1 · axial · 4.0mm · 0.28mm/px · z∈[-89,+78]mm · 3 of 44 slices shown (2 of 2)]
[im 6/44]
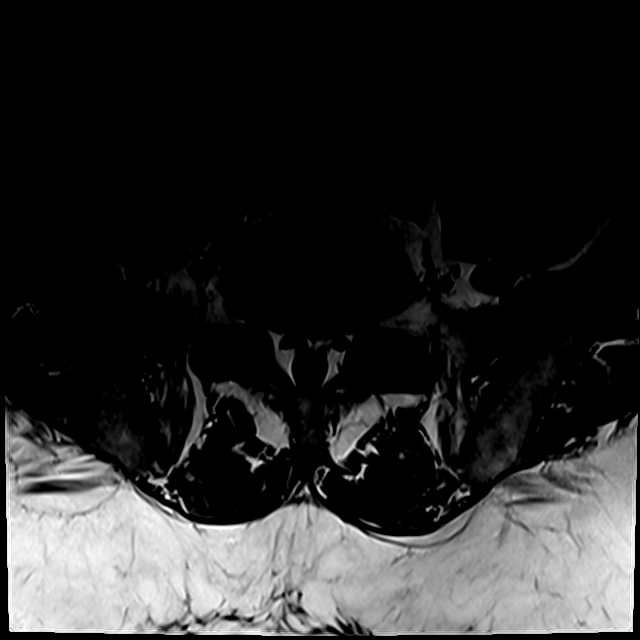
[im 23/44]
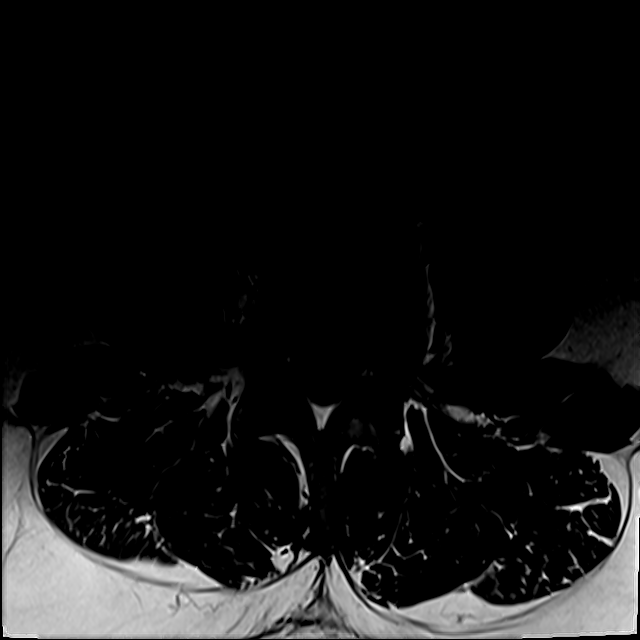
[im 38/44]
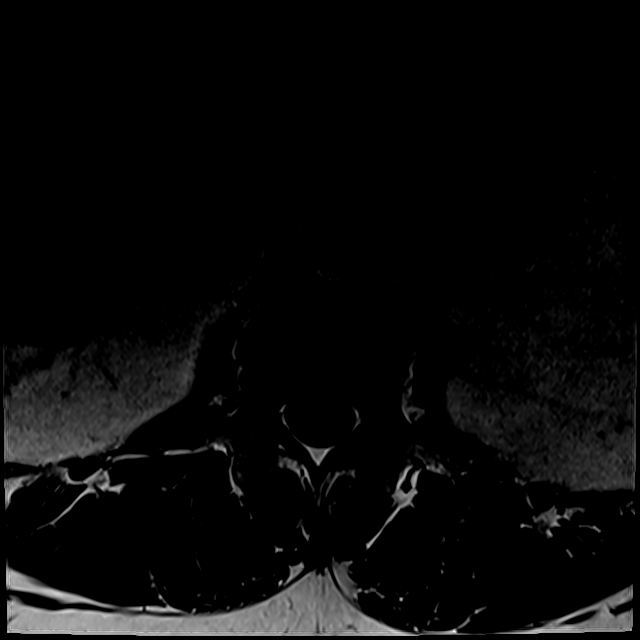

[Series 14: T2 · axial · 4.0mm · 0.28mm/px · z∈[-104,+78]mm · 6 of 44 slices shown (2 of 2)]
[im 3/44]
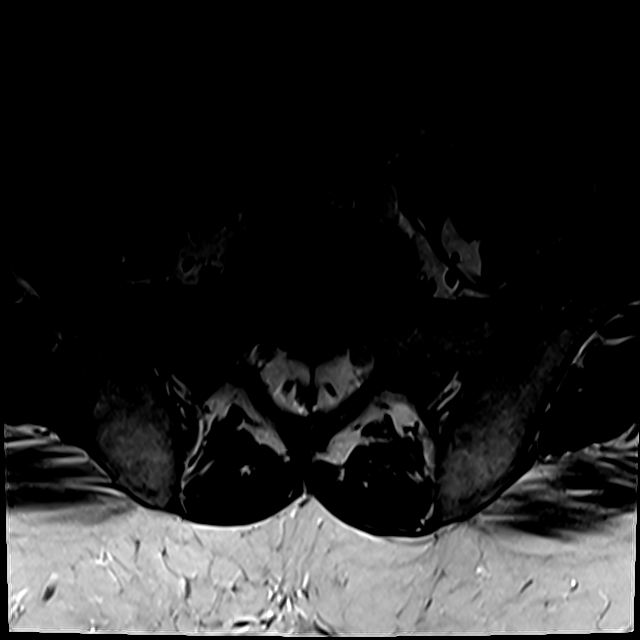
[im 6/44]
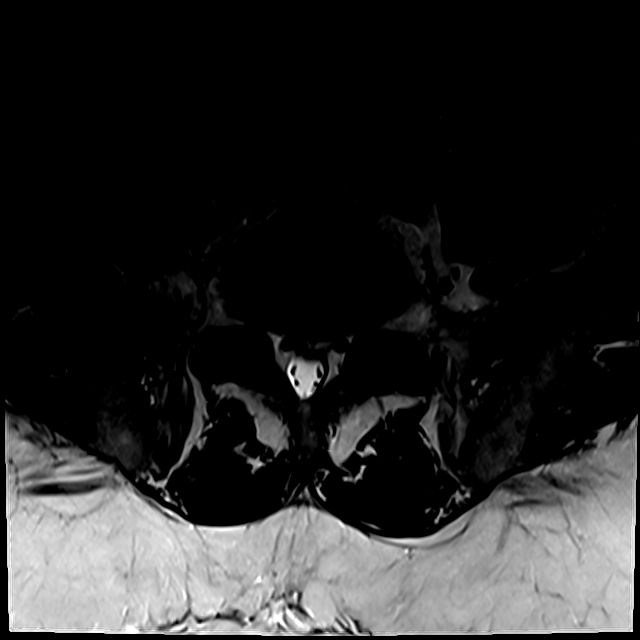
[im 9/44]
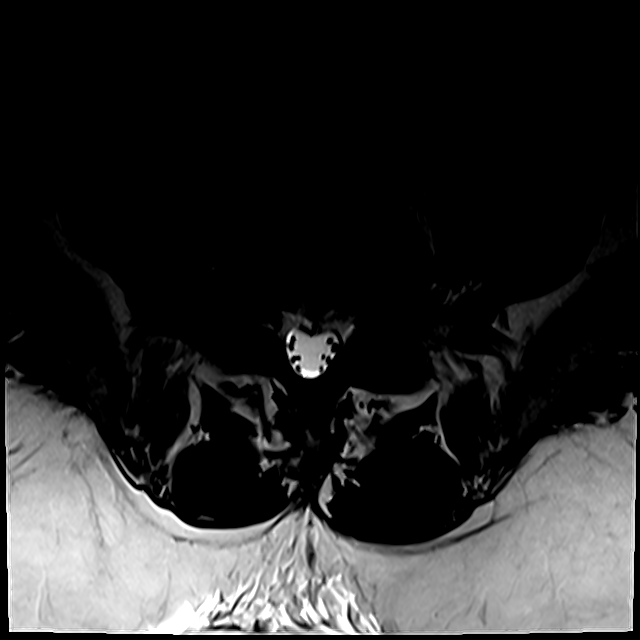
[im 15/44]
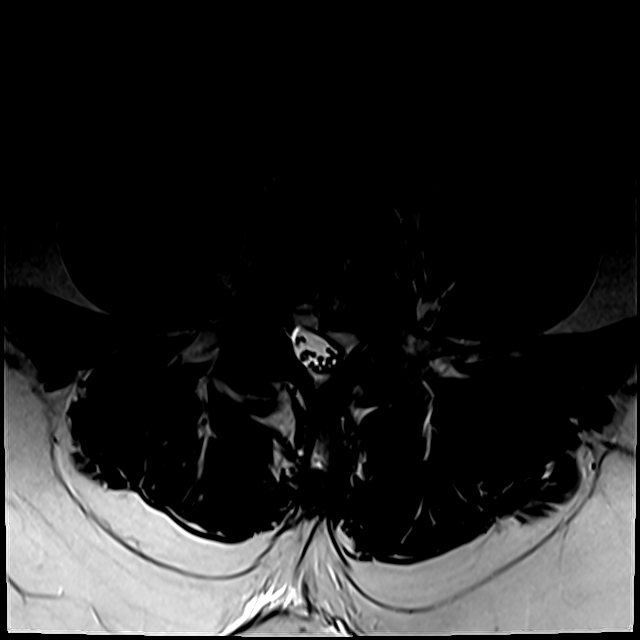
[im 23/44]
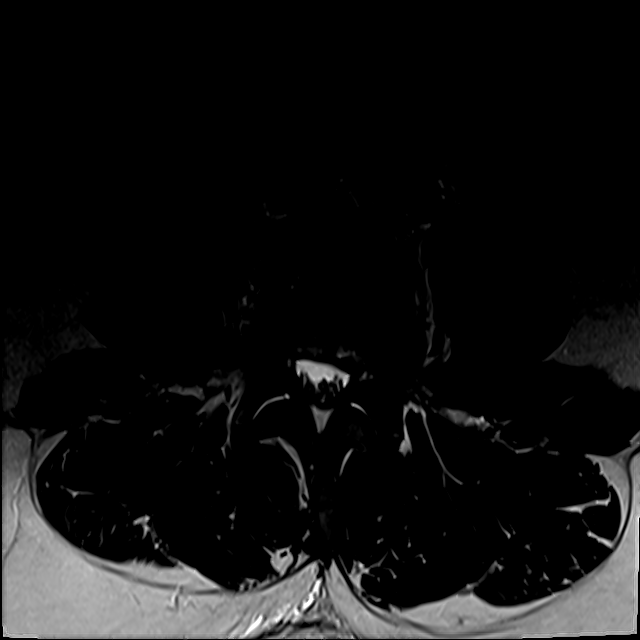
[im 38/44]
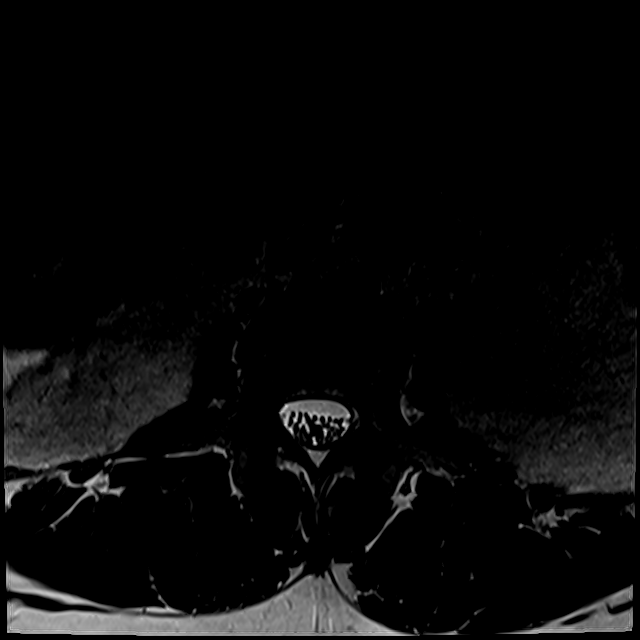

[18 of 48 positions shown; findings below may reference images not displayed]

FINDINGS: Segmentation:  Normal on the comparison.

Alignment: Stable. Preserved lumbar lordosis with slight levoconvex
lumbar curvature.

Vertebrae: No marrow edema or evidence of acute osseous abnormality.
Benign vertebral hemangiomas in L2 and L4. Intact visible sacrum and
SI joints.

Conus medullaris and cauda equina: Conus extends to the L1 level and
appears normal.

Paraspinal and other soft tissues: Negative.

Disc levels:

T11-T12: Negative.

T12-L1:  Negative.

L1-L2: Circumferential disc bulge with small left foraminal disc
protrusion. No spinal or lateral recess stenosis. Mild to moderate
left L1 foraminal stenosis.

L2-L3: Circumferential, mostly far lateral disc bulging. Mild facet
hypertrophy. Mild left greater than right L2 foraminal stenosis.

L3-L4: Mild far lateral disc bulging and endplate spurring. Mild
facet hypertrophy. No spinal or lateral recess stenosis. Symmetric
appearing mild bilateral L3 foraminal stenosis.

L4-L5: Circumferential disc bulge with left paracentral cephalad
disc extrusion posterior to the L4 vertebral body with sequestered
disc fragment encompassing 6 x 11 x 12 millimeters (AP by transverse
by CC). See series 7, image 9 and series 11, image 31. Superimposed
mild to moderate facet hypertrophy. There is no spinal stenosis
match that there is no significant spinal stenosis scratched at
there is only mild spinal stenosis and the left lateral recess at
the disc space level is relatively spared, but disc material
severely affects the course of the exiting left L4 nerve with severe
left foraminal stenosis. There is contralateral mild to moderate
right foraminal stenosis.

L5-S1: Negative disc. Moderate facet hypertrophy. Mild L5 foraminal
stenosis.
IMPRESSION: 1. The symptomatic level is favored to be L4-L5 where of left
paracentral cephalad disc extrusion and 12 mm sequestered disc
fragment severely affect the course of the left L4 nerve. Up to mild
associated spinal stenosis.
2. Left eccentric disc degeneration also at L1-L2 and L2-L3
resulting in foraminal stenosis.

## 2022-05-15 ENCOUNTER — Other Ambulatory Visit: Payer: Self-pay | Admitting: Internal Medicine

## 2022-05-15 ENCOUNTER — Other Ambulatory Visit (HOSPITAL_COMMUNITY): Payer: Self-pay | Admitting: Internal Medicine

## 2022-05-15 ENCOUNTER — Other Ambulatory Visit: Payer: Self-pay | Admitting: Registered Nurse

## 2022-05-15 DIAGNOSIS — E119 Type 2 diabetes mellitus without complications: Secondary | ICD-10-CM

## 2022-05-31 ENCOUNTER — Encounter (HOSPITAL_COMMUNITY): Payer: Self-pay

## 2022-05-31 ENCOUNTER — Ambulatory Visit (HOSPITAL_COMMUNITY)
Admission: RE | Admit: 2022-05-31 | Discharge: 2022-05-31 | Disposition: A | Discharge status: Home / Self Care | Payer: Self-pay | Source: Ambulatory Visit | Attending: Registered Nurse | Admitting: Registered Nurse

## 2022-05-31 DIAGNOSIS — E119 Type 2 diabetes mellitus without complications: Secondary | ICD-10-CM | POA: Insufficient documentation

## 2022-09-13 ENCOUNTER — Encounter (HOSPITAL_BASED_OUTPATIENT_CLINIC_OR_DEPARTMENT_OTHER): Payer: Self-pay | Admitting: Orthopedic Surgery

## 2022-09-20 NOTE — Progress Notes (Signed)

## 2022-09-21 ENCOUNTER — Ambulatory Visit (HOSPITAL_BASED_OUTPATIENT_CLINIC_OR_DEPARTMENT_OTHER): Payer: BC Managed Care – PPO | Admitting: Anesthesiology

## 2022-09-21 ENCOUNTER — Encounter (HOSPITAL_BASED_OUTPATIENT_CLINIC_OR_DEPARTMENT_OTHER)
Admission: RE | Disposition: A | Discharge status: Home / Self Care | Payer: Self-pay | Source: Home / Self Care | Attending: Orthopedic Surgery

## 2022-09-21 ENCOUNTER — Encounter (HOSPITAL_BASED_OUTPATIENT_CLINIC_OR_DEPARTMENT_OTHER): Payer: Self-pay | Admitting: Orthopedic Surgery

## 2022-09-21 ENCOUNTER — Other Ambulatory Visit: Payer: Self-pay

## 2022-09-21 ENCOUNTER — Ambulatory Visit (HOSPITAL_BASED_OUTPATIENT_CLINIC_OR_DEPARTMENT_OTHER)
Admission: RE | Admit: 2022-09-21 | Discharge: 2022-09-21 | Disposition: A | Discharge status: Home / Self Care | Payer: BC Managed Care – PPO | Attending: Orthopedic Surgery | Admitting: Orthopedic Surgery

## 2022-09-21 DIAGNOSIS — M94262 Chondromalacia, left knee: Secondary | ICD-10-CM | POA: Diagnosis not present

## 2022-09-21 DIAGNOSIS — S83232A Complex tear of medial meniscus, current injury, left knee, initial encounter: Secondary | ICD-10-CM | POA: Diagnosis not present

## 2022-09-21 DIAGNOSIS — E119 Type 2 diabetes mellitus without complications: Secondary | ICD-10-CM | POA: Diagnosis not present

## 2022-09-21 DIAGNOSIS — Z87891 Personal history of nicotine dependence: Secondary | ICD-10-CM | POA: Insufficient documentation

## 2022-09-21 DIAGNOSIS — Z6841 Body Mass Index (BMI) 40.0 and over, adult: Secondary | ICD-10-CM | POA: Diagnosis not present

## 2022-09-21 DIAGNOSIS — M659 Synovitis and tenosynovitis, unspecified: Secondary | ICD-10-CM | POA: Diagnosis not present

## 2022-09-21 DIAGNOSIS — Z01818 Encounter for other preprocedural examination: Secondary | ICD-10-CM

## 2022-09-21 DIAGNOSIS — M2242 Chondromalacia patellae, left knee: Secondary | ICD-10-CM | POA: Diagnosis not present

## 2022-09-21 DIAGNOSIS — G4730 Sleep apnea, unspecified: Secondary | ICD-10-CM | POA: Diagnosis not present

## 2022-09-21 HISTORY — PX: KNEE ARTHROSCOPY WITH MEDIAL MENISECTOMY: SHX5651

## 2022-09-21 SURGERY — ARTHROSCOPY, KNEE, WITH MEDIAL MENISCECTOMY
Anesthesia: General | Site: Knee | Laterality: Left

## 2022-09-21 MED ORDER — ONDANSETRON HCL 4 MG/2ML IJ SOLN
INTRAMUSCULAR | Status: AC
  Filled 2022-09-21: qty 2

## 2022-09-21 MED ORDER — DEXAMETHASONE SODIUM PHOSPHATE 10 MG/ML IJ SOLN
INTRAMUSCULAR | Status: AC
  Filled 2022-09-21: qty 1

## 2022-09-21 MED ORDER — DEXAMETHASONE SODIUM PHOSPHATE 10 MG/ML IJ SOLN
INTRAMUSCULAR | Status: DC | PRN
  Administered 2022-09-21: 10 mg via INTRAVENOUS

## 2022-09-21 MED ORDER — FENTANYL CITRATE (PF) 100 MCG/2ML IJ SOLN
INTRAMUSCULAR | Status: AC
  Filled 2022-09-21: qty 2

## 2022-09-21 MED ORDER — HYDROCODONE-ACETAMINOPHEN 5-325 MG PO TABS: 1.0000 | ORAL_TABLET | Freq: Four times a day (QID) | ORAL | 0 refills | Status: AC | PRN

## 2022-09-21 MED ORDER — LIDOCAINE 2% (20 MG/ML) 5 ML SYRINGE
INTRAMUSCULAR | Status: DC | PRN
  Administered 2022-09-21: 100 mg via INTRAVENOUS

## 2022-09-21 MED ORDER — LACTATED RINGERS IV SOLN: INTRAVENOUS | Status: DC

## 2022-09-21 MED ORDER — PROPOFOL 10 MG/ML IV BOLUS
INTRAVENOUS | Status: AC
  Filled 2022-09-21: qty 20

## 2022-09-21 MED ORDER — ONDANSETRON HCL 4 MG PO TABS: 4.0000 mg | ORAL_TABLET | Freq: Three times a day (TID) | ORAL | 0 refills | Status: AC | PRN

## 2022-09-21 MED ORDER — PROPOFOL 10 MG/ML IV BOLUS
INTRAVENOUS | Status: DC | PRN
  Administered 2022-09-21: 200 mg via INTRAVENOUS

## 2022-09-21 MED ORDER — FENTANYL CITRATE (PF) 100 MCG/2ML IJ SOLN
25.0000 ug | INTRAMUSCULAR | Status: DC | PRN
  Administered 2022-09-21: 50 ug via INTRAVENOUS

## 2022-09-21 MED ORDER — OXYCODONE HCL 5 MG PO TABS
5.0000 mg | ORAL_TABLET | Freq: Once | ORAL | Status: AC
  Administered 2022-09-21: 5 mg via ORAL

## 2022-09-21 MED ORDER — ACETAMINOPHEN 500 MG PO TABS
ORAL_TABLET | ORAL | Status: AC
  Filled 2022-09-21: qty 2

## 2022-09-21 MED ORDER — CEFAZOLIN IN SODIUM CHLORIDE 3-0.9 GM/100ML-% IV SOLN
3.0000 g | INTRAVENOUS | Status: AC
  Administered 2022-09-21: 3 g via INTRAVENOUS

## 2022-09-21 MED ORDER — ACETAMINOPHEN 500 MG PO TABS
1000.0000 mg | ORAL_TABLET | Freq: Once | ORAL | Status: AC
  Administered 2022-09-21: 1000 mg via ORAL

## 2022-09-21 MED ORDER — ONDANSETRON HCL 4 MG/2ML IJ SOLN: 4.0000 mg | Freq: Once | INTRAMUSCULAR | Status: DC | PRN

## 2022-09-21 MED ORDER — CEFAZOLIN IN SODIUM CHLORIDE 3-0.9 GM/100ML-% IV SOLN
INTRAVENOUS | Status: AC
  Filled 2022-09-21: qty 100

## 2022-09-21 MED ORDER — OXYCODONE HCL 5 MG PO TABS
ORAL_TABLET | ORAL | Status: AC
  Filled 2022-09-21: qty 1

## 2022-09-21 MED ORDER — BUPIVACAINE HCL (PF) 0.25 % IJ SOLN
INTRAMUSCULAR | Status: DC | PRN
  Administered 2022-09-21: 20 mL via INTRA_ARTICULAR

## 2022-09-21 MED ORDER — ONDANSETRON HCL 4 MG/2ML IJ SOLN
INTRAMUSCULAR | Status: DC | PRN
  Administered 2022-09-21: 4 mg via INTRAVENOUS

## 2022-09-21 MED ORDER — LIDOCAINE 2% (20 MG/ML) 5 ML SYRINGE
INTRAMUSCULAR | Status: AC
  Filled 2022-09-21: qty 5

## 2022-09-21 MED ORDER — FENTANYL CITRATE (PF) 100 MCG/2ML IJ SOLN
INTRAMUSCULAR | Status: DC | PRN
  Administered 2022-09-21 (×4): 25 ug via INTRAVENOUS

## 2022-09-21 MED ORDER — SODIUM CHLORIDE 0.9 % IR SOLN
Status: DC | PRN
  Administered 2022-09-21: 1000 mL

## 2022-09-21 SURGICAL SUPPLY — 38 items
BANDAGE ESMARK 6X9 LF (GAUZE/BANDAGES/DRESSINGS) IMPLANT
BLADE SHAVER TORPEDO 4X13 (MISCELLANEOUS) ×1 IMPLANT
BNDG CMPR 5X62 HK CLSR LF (GAUZE/BANDAGES/DRESSINGS) ×1
BNDG CMPR 6"X 5 YARDS HK CLSR (GAUZE/BANDAGES/DRESSINGS) ×1
BNDG CMPR 9X6 STRL LF SNTH (GAUZE/BANDAGES/DRESSINGS)
BNDG ELASTIC 6INX 5YD STR LF (GAUZE/BANDAGES/DRESSINGS) ×1 IMPLANT
BNDG ESMARK 6X9 LF (GAUZE/BANDAGES/DRESSINGS)
BURR OVAL 8 FLU 4.0X13 (MISCELLANEOUS) IMPLANT
BURR OVAL 8 FLU 5.0X13 (MISCELLANEOUS) IMPLANT
CUFF TOURN SGL QUICK 34 (TOURNIQUET CUFF)
CUFF TRNQT CYL 34X4.125X (TOURNIQUET CUFF) IMPLANT
CUTTER BONE 4.0MM X 13CM (MISCELLANEOUS) IMPLANT
DRAPE INCISE IOBAN 66X45 STRL (DRAPES) IMPLANT
DRAPE U-SHAPE 47X51 STRL (DRAPES) ×1 IMPLANT
DRAPE-T ARTHROSCOPY W/POUCH (DRAPES) ×1 IMPLANT
DURAPREP 26ML APPLICATOR (WOUND CARE) ×1 IMPLANT
ELECT REM PT RETURN 9FT ADLT (ELECTROSURGICAL)
ELECTRODE REM PT RTRN 9FT ADLT (ELECTROSURGICAL) IMPLANT
GAUZE PAD ABD 8X10 STRL (GAUZE/BANDAGES/DRESSINGS) ×1 IMPLANT
GAUZE SPONGE 4X4 12PLY STRL (GAUZE/BANDAGES/DRESSINGS) ×1 IMPLANT
GAUZE XEROFORM 1X8 LF (GAUZE/BANDAGES/DRESSINGS) ×1 IMPLANT
GLOVE BIO SURGEON STRL SZ7.5 (GLOVE) ×2 IMPLANT
GLOVE INDICATOR 8.0 STRL GRN (GLOVE) ×2 IMPLANT
GOWN STRL REUS W/ TWL LRG LVL3 (GOWN DISPOSABLE) ×2 IMPLANT
GOWN STRL REUS W/ TWL XL LVL3 (GOWN DISPOSABLE) ×1 IMPLANT
GOWN STRL REUS W/TWL LRG LVL3 (GOWN DISPOSABLE) ×2
GOWN STRL REUS W/TWL XL LVL3 (GOWN DISPOSABLE) ×1
KIT BASIN OR (CUSTOM PROCEDURE TRAY) ×1 IMPLANT
MANIFOLD NEPTUNE II (INSTRUMENTS) ×1 IMPLANT
PACK ARTHROSCOPY DSU (CUSTOM PROCEDURE TRAY) ×1 IMPLANT
PORT APPOLLO RF 90DEGREE MULTI (SURGICAL WAND) IMPLANT
SHEET MEDIUM DRAPE 40X70 STRL (DRAPES) ×1 IMPLANT
STRIP CLOSURE SKIN 1/2X4 (GAUZE/BANDAGES/DRESSINGS) ×1 IMPLANT
SUT ETHILON 4 0 PS 2 18 (SUTURE) ×1 IMPLANT
SUT MNCRL AB 3-0 PS2 18 (SUTURE) ×1 IMPLANT
TOWEL GREEN STERILE FF (TOWEL DISPOSABLE) ×1 IMPLANT
TUBE CONNECTING 20X1/4 (TUBING) IMPLANT
TUBING ARTHROSCOPY IRRIG 16FT (MISCELLANEOUS) ×1 IMPLANT

## 2022-09-21 NOTE — Brief Op Note (Signed)
09/21/2022  11:55 AM  PATIENT:  Trevor Evans  72 y.o. male  PRE-OPERATIVE DIAGNOSIS:  Left knee medial meniscus tear  POST-OPERATIVE DIAGNOSIS:  Left knee medial meniscus tear  PROCEDURE:  Procedure(s): KNEE ARTHROSCOPY WITH PARTIAL  MEDIAL MENISECTOMY (Left)  SURGEON:  Surgeon(s) and Role:    [name], MD - Primary  ASSISTANTS: Jonelle Sidle, PA-C   ANESTHESIA:   local and general  EBL:  5 mL   BLOOD ADMINISTERED:none  DRAINS: none   LOCAL MEDICATIONS USED:  MARCAINE     SPECIMEN:  No Specimen  DISPOSITION OF SPECIMEN:  N/A  COUNTS:  YES  TOURNIQUET:  * Missing tourniquet times found for documented tourniquets in log: [id number] *  DICTATION: .Note written in EPIC  PLAN OF CARE: Discharge to home after PACU  PATIENT DISPOSITION:  PACU - hemodynamically stable.   Delay start of Pharmacological VTE agent (>24hrs) due to surgical blood loss or risk of bleeding: not applicable

## 2022-09-21 NOTE — Anesthesia Postprocedure Evaluation (Signed)
Anesthesia Post Note  Patient: Trevor Evans  Procedure(s) Performed: KNEE ARTHROSCOPY WITH PARTIAL  MEDIAL MENISECTOMY (Left: Knee)     Patient location during evaluation: PACU Anesthesia Type: General Level of consciousness: awake and alert Pain management: pain level controlled Vital Signs Assessment: post-procedure vital signs reviewed and stable Respiratory status: spontaneous breathing, nonlabored ventilation, respiratory function stable and patient connected to nasal cannula oxygen Cardiovascular status: blood pressure returned to baseline and stable Postop Assessment: no apparent nausea or vomiting Anesthetic complications: no   No notable events documented.  Last Vitals:  Vitals:   09/21/22 1215 09/21/22 1257  BP: (!) 150/82 (!) 148/85  Pulse: 74 73  Resp: 14 16  Temp:  (!) 36.3 C  SpO2: 95% 96%    Last Pain:  Vitals:   09/21/22 1257  TempSrc: Oral  PainSc: [place]

## 2022-09-21 NOTE — H&P (Signed)
ORTHOPAEDIC H&P  REQUESTING PHYSICIAN: [name], MD  PCP:  Holland Commons, FNP  Chief Complaint: Left knee medial meniscus tear  HPI: Trevor Evans is a 72 y.o. male who complains of left knee pain and mechanical symptoms.  Symptoms been recalcitrant to conservative treatment.  Here today for arthroscopic intervention.  No new complaints at this time  Past Medical History:  Diagnosis Date   Arthritis    RA   Celiac disease    Diabetes mellitus type II    diet controlled/ borderline   Diverticulosis    Esophageal ulcer    GERD (gastroesophageal reflux disease)    Hyperlipidemia    Internal hemorrhoids    Sleep apnea    Wears CPAP nightly   Past Surgical History:  Procedure Laterality Date   CHOLECYSTECTOMY N/A 07/08/2014   Procedure: LAPAROSCOPIC CHOLECYSTECTOMY;  Surgeon: Ralene Ok, MD;  Location: [facility] OR;  Service: General;  Laterality: N/A;   COLONOSCOPY     POLYPECTOMY     TONSILLECTOMY     UPPER GASTROINTESTINAL ENDOSCOPY     Social History   Socioeconomic History   Marital status: Married    Spouse name: Not on file   Number of children: 1   Years of education: Not on file   Highest education level: Not on file  Occupational History   Occupation: Product manager: [employer]  Tobacco Use   Smoking status: Former    Types: Cigarettes    Quit date: 07/23/1973    Years since quitting: 49.1   Smokeless tobacco: Never  Substance and Sexual Activity   Alcohol use: No   Drug use: No   Sexual activity: Not on file  Other Topics Concern   Not on file  Social History Narrative   Not on file   Social Determinants of Health   Financial Resource Strain: Not on file  Food Insecurity: Not on file  Transportation Needs: Not on file  Physical Activity: Not on file  Stress: Not on file  Social Connections: Not on file   Family History  Problem Relation Age of Onset   Diabetes Father    Kidney disease Father    Emphysema  Father    Migraines Mother    Colon cancer Neg Hx    Colon polyps Neg Hx    Esophageal cancer Neg Hx    Rectal cancer Neg Hx    Stomach cancer Neg Hx    Allergies  Allergen Reactions   Gluten Meal Other (See Comments)    Celiac disease   Prior to Admission medications   Medication Sig Start Date End Date Taking? Authorizing Provider  gabapentin (NEURONTIN) 300 MG capsule Take 600 mg by mouth daily. 03/13/20  Yes [provider]  IRON PO Take 1 tablet by mouth daily.   Yes [provider]  meloxicam (MOBIC) 15 MG tablet Take 15 mg by mouth daily. 01/30/20  Yes [provider]  Multiple Vitamins-Minerals (CENTRUM SILVER PO) Take 1 tablet by mouth daily.     Yes [provider]  OTEZLA 30 MG TABS Take 1 tablet by mouth 2 (two) times daily. 04/25/20  Yes [provider]   No results found.  Positive ROS: All other systems have been reviewed and were otherwise negative with the exception of those mentioned in the HPI and as above.  Physical Exam: General: Alert, no acute distress Cardiovascular: No pedal edema Respiratory: No cyanosis, no use of accessory musculature GI:  No organomegaly, abdomen is soft and non-tender Skin: No lesions in the area of chief complaint Neurologic: Sensation intact distally Psychiatric: Patient is competent for consent with normal mood and affect Lymphatic: No axillary or cervical lymphadenopathy  MUSCULOSKELETAL: Left lower extremity is warm and well-perfused.  No open wounds or lesions.  Neurovascular intact.  Assessment: 1.  Left knee medial meniscus tear complex, initial encounter.  Plan: Plan to proceed today with arthroscopic treatment of the left knee.  We discussed the risk and benefits of the procedure in detail including but not limited to bleeding, infection, damage to surrounding nerves and vessels, stiffness, failure of pain life, progression of arthritis, development of DVT, as well as the risk of  anesthesia.  He has provided informed consent.  -Plan for discharge home postoperatively from PACU.    [name], MD Cell [phone]    09/21/2022 11:07 AM

## 2022-09-21 NOTE — Op Note (Signed)
Surgery Date: 09/21/2022  Surgeon(s): [name], MD  Assistant:  Jonelle Sidle, PA-C  Assistant attestation:  PA McClung present for the entire procedure.  ANESTHESIA:  general, and local anesthetic with 20 cc of quarter percent plain Marcaine  FLUIDS: Per anesthesia record.   ESTIMATED BLOOD LOSS: minimal  PREOPERATIVE DIAGNOSES:  1.  Left knee complex medial meniscus tear, initial encounter. 2.  Left  knee synovitis 3.  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  Left knee arthroscopy with limited synovectomy 2.  Left knee arthroscopy with arthroscopic partial medial meniscectomy 3. Left knee arthroscopy with arthroscopic chondroplasty medial femoral condyle and medial tibial plateau.   DESCRIPTION OF PROCEDURE: Mr. Gianino is a 72 y.o.-year-old male with left knee complex medial meniscus tear. Plans are to proceed with partial medial meniscectomy and diagnostic arthroscopy with debridement as indicated. Full discussion held regarding risks benefits alternatives and complications related surgical intervention. Conservative care options reviewed. All questions answered.  The patient was identified in the preoperative holding area and the operative extremity was marked. The patient was brought to the operating room and transferred to operating table in a supine position. Satisfactory general anesthesia was induced by anesthesiology.    Standard anterolateral, anteromedial arthroscopy portals were obtained. The anteromedial portal was obtained with a spinal needle for localization under direct visualization with subsequent diagnostic findings.   Anteromedial and anterolateral chambers: mild synovitis. The synovitis was debrided with a 4.5 mm full radius shaver through both the anteromedial and lateral portals.   Suprapatellar pouch and gutters: no synovitis or  debris. Patella chondral surface: Grade 0 Trochlear chondral surface: Grade 0 Patellofemoral tracking: midline and level  Medial meniscus: Complex tear with horizontal component as the primary component from the mid body to the posterior horn junction.  Also a short radial tear at the root which involves approximately 20% of the depth of the tissue, but with a solid root attachment.  Medial femoral condyle flexion bearing surface: Grade 2 Medial femoral condyle extension bearing surface: Grade 1 Medial tibial plateau: Grade 2 Anterior cruciate ligament:stable Posterior cruciate ligament:stable Lateral meniscus: Intact without tearing.   Lateral femoral condyle flexion bearing surface: Grade 0 Lateral femoral condyle extension bearing surface: Grade 0 Lateral tibial plateau: Grade 0  Partial lateral meniscectomy was undertaken with combination of meniscal biter as well as motorized shaver.  The short radial tear at the root was addressed just with a motorized shaver.  In the mid body portion the arthroscopic baskets was used to remove the unstable flipped fragment along the medial tibial plateau in the medial gutter.  This was delivered into the joint with the probe and then resected.  We then removed any further unstable fragments with motorized shaver.  After completion of synovectomy, diagnostic exam, and debridements as described, all compartments were checked and no residual debris remained. Hemostasis was achieved with the cautery wand. The portals were approximated with buried monocryl. All excess fluid was expressed from the joint.  Xeroform sterile gauze dressings were applied followed by Ace bandage and ice pack.   DISPOSITION: The patient was awakened from general anesthetic, extubated, taken to the recovery room in medically stable condition, no apparent complications. The patient may be weightbearing as tolerated to the operative lower extremity.  Range of motion of left knee as  tolerated.

## 2022-09-21 NOTE — Discharge Instructions (Addendum)
Post-operative patient instructions  Knee Arthroscopy   Ice:  Place intermittent ice or cooler pack over your knee, 30 minutes on and 30 minutes off.  Continue this for the first 72 hours after surgery, then save ice for use after therapy sessions or on more active days.   Weight:  You may bear weight on your leg as your symptoms allow. DVT prevention: Perform ankle pumps as able throughout the day on the operative extremity.  Be mobile as possible with ambulation as able.  You should also take an 81 mg aspirin once per day x6 weeks. Crutches:  Use crutches (or walker) to assist in walking until told to discontinue by your physical therapist or physician. This will help to reduce pain. Strengthening:  Perform simple thigh squeezes (isometric quad contractions) and straight leg lifts as you are able (3 sets of 5 to 10 repetitions, 3 times a day).  For the leg lifts, have someone support under your ankle in the beginning until you have increased strength enough to do this on your own.  To help get started on thigh squeezes, place a pillow under your knee and push down on the pillow with back of knee (sometimes easier to do than with your leg fully straight). Motion:  Perform gentle knee motion as tolerated - this is gentle bending and straightening of the knee. Seated heel slides: you can start by sitting in a chair, remove your brace, and gently slide your heel back on the floor - allowing your knee to bend. Have someone help you straighten your knee (or use your other leg/foot hooked under your ankle.  Dressing:  Perform 1st dressing change at 3 days postoperative. A moderate amount of blood tinged drainage is to be expected.  So if you bleed through the dressing on the first or second day or if you have fevers, it is fine to change the dressing/check the wounds early and redress wound. Elevate your leg.  If it bleeds through again, or if the incisions are leaking frank blood, please call the office. May  change dressing every 1-2 days thereafter to help watch wounds. Can purchase Tegderm (or 40M Nexcare) water resistant dressings at local pharmacy / [facility]. Shower:   shower is ok after 3 days.  Please take shower, NO bath. Recover with gauze and ace wrap to help keep wounds protected.   Pain medication:  A narcotic pain medication has been prescribed.  Take as directed.  Typically you need narcotic pain medication more regularly during the first 3 to 5 days after surgery.  Decrease your use of the medication as the pain improves.  Narcotics can sometimes cause constipation, even after a few doses.  If you have problems with constipation, you can take an over the counter stool softener or light laxative.  If you have persistent problems, please notify your physician's office. Physical therapy: Additional activity guidelines to be provided by your physician or physical therapist at follow-up visits.  Driving: Do not recommend driving x 1-2 weeks post surgical, especially if surgery performed on right side. Should not drive while taking narcotic pain medications. It typically takes at least 2 weeks to restore sufficient neuromuscular function for normal reaction times for driving safety.  Call [phone] for questions or problems. Evenings you will be forwarded to the hospital operator.  Ask for the orthopaedic physician on call. Please call if you experience:    Redness, foul smelling, or persistent drainage from the surgical site  worsening knee pain and  swelling not responsive to medication  any calf pain and or swelling of the lower leg  temperatures greater than 101.5 F other questions or concerns   Thank you for allowing [place] to be a part of your care May take Tylenol after 4 pm, if needed.    Post Anesthesia Home Care Instructions  Activity: Get plenty of rest for the remainder of the day. A responsible individual must stay with you for 24 hours following the procedure.  For the next 24  hours, DO NOT: -Drive a car -Paediatric nurse -Drink alcoholic beverages -Take any medication unless instructed by your physician -Make any legal decisions or sign important papers.  Meals: Start with liquid foods such as gelatin or soup. Progress to regular foods as tolerated. Avoid greasy, spicy, heavy foods. If nausea and/or vomiting occur, drink only clear liquids until the nausea and/or vomiting subsides. Call your physician if vomiting continues.  Special Instructions/Symptoms: Your throat may feel dry or sore from the anesthesia or the breathing tube placed in your throat during surgery. If this causes discomfort, gargle with warm salt water. The discomfort should disappear within 24 hours.  If you had a scopolamine patch placed behind your ear for the management of post- operative nausea and/or vomiting:  1. The medication in the patch is effective for 72 hours, after which it should be removed.  Wrap patch in a tissue and discard in the trash. Wash hands thoroughly with soap and water. 2. You may remove the patch earlier than 72 hours if you experience unpleasant side effects which may include dry mouth, dizziness or visual disturbances. 3. Avoid touching the patch. Wash your hands with soap and water after contact with the patch.

## 2022-09-21 NOTE — Anesthesia Procedure Notes (Signed)
Procedure Name: LMA Insertion Date/Time: 09/21/2022 11:25 AM  Performed by: Genelle Bal, CRNAPre-anesthesia Checklist: Patient identified, Emergency Drugs available, Suction available and Patient being monitored Patient Re-evaluated:Patient Re-evaluated prior to induction Oxygen Delivery Method: Circle system utilized Preoxygenation: Pre-oxygenation with 100% oxygen Induction Type: IV induction Ventilation: Mask ventilation without difficulty LMA: LMA inserted LMA Size: 5.0 Number of attempts: 1 Airway Equipment and Method: Bite block Placement Confirmation: positive ETCO2 Tube secured with: Tape Dental Injury: Teeth and Oropharynx as per pre-operative assessment

## 2022-09-21 NOTE — Transfer of Care (Signed)
Immediate Anesthesia Transfer of Care Note  Patient: DETRICK LOCKERMAN  Procedure(s) Performed: KNEE ARTHROSCOPY WITH PARTIAL  MEDIAL MENISECTOMY (Left: Knee)  Patient Location: PACU  Anesthesia Type:General  Level of Consciousness: drowsy and patient cooperative  Airway & Oxygen Therapy: Patient Spontanous Breathing and Patient connected to face mask oxygen  Post-op Assessment: Report given to RN and Post -op Vital signs reviewed and stable  Post vital signs: Reviewed and stable  Last Vitals:  Vitals Value Taken Time  BP 114/62   Temp    Pulse 75 09/21/22 1155  Resp 19 09/21/22 1155  SpO2 100 % 09/21/22 1155  Vitals shown include unvalidated device data.  Last Pain:  Vitals:   09/21/22 1021  TempSrc: Oral  PainSc: 0-No pain      Patients Stated Pain Goal: 4 ([id number])  Complications: No notable events documented.

## 2022-09-21 NOTE — Anesthesia Preprocedure Evaluation (Addendum)
Anesthesia Evaluation  Patient identified by MRN, date of birth, ID band Patient awake    Reviewed: Allergy & Precautions, NPO status , Patient's Chart, lab work & pertinent test results  Airway Mallampati: III  TM Distance: <3 FB Neck ROM: Full    Dental  (+) Teeth Intact, Dental Advisory Given   Pulmonary sleep apnea and Continuous Positive Airway Pressure Ventilation , former smoker   Pulmonary exam normal breath sounds clear to auscultation       Cardiovascular negative cardio ROS Normal cardiovascular exam Rhythm:Regular Rate:Normal     Neuro/Psych negative neurological ROS     GI/Hepatic Neg liver ROS, PUD,GERD  Controlled,,Celiac disease    Endo/Other  diabetes, Well Controlled, Type 2  Morbid obesity  Renal/GU negative Renal ROS     Musculoskeletal  (+) Arthritis , Rheumatoid disorders,  Left knee medial meniscus tear   Abdominal   Peds  Hematology negative hematology ROS (+)   Anesthesia Other Findings Day of surgery medications reviewed with the patient.  Reproductive/Obstetrics                             Anesthesia Physical Anesthesia Plan  ASA: 3  Anesthesia Plan: General   Post-op Pain Management: Tylenol PO (pre-op)* and Toradol IV (intra-op)*   Induction: Intravenous  PONV Risk Score and Plan: 2 and Dexamethasone and Ondansetron  Airway Management Planned: LMA  Additional Equipment:   Intra-op Plan:   Post-operative Plan: Extubation in OR  Informed Consent: I have reviewed the patients History and Physical, chart, labs and discussed the procedure including the risks, benefits and alternatives for the proposed anesthesia with the patient or authorized representative who has indicated his/her understanding and acceptance.     Dental advisory given  Plan Discussed with: CRNA  Anesthesia Plan Comments:         Anesthesia Quick Evaluation

## 2022-09-24 ENCOUNTER — Encounter (HOSPITAL_BASED_OUTPATIENT_CLINIC_OR_DEPARTMENT_OTHER): Payer: Self-pay | Admitting: Orthopedic Surgery

## 2024-02-07 ENCOUNTER — Encounter: Payer: Self-pay | Admitting: Neurological Surgery

## 2024-02-07 ENCOUNTER — Other Ambulatory Visit: Payer: Self-pay | Admitting: Neurological Surgery

## 2024-02-07 DIAGNOSIS — M5416 Radiculopathy, lumbar region: Secondary | ICD-10-CM

## 2024-02-11 ENCOUNTER — Inpatient Hospital Stay
Admission: RE | Admit: 2024-02-11 | Discharge: 2024-02-11 | Discharge status: Home / Self Care | Source: Ambulatory Visit | Attending: Neurological Surgery | Admitting: Neurological Surgery

## 2024-02-11 DIAGNOSIS — M5416 Radiculopathy, lumbar region: Secondary | ICD-10-CM

## 2024-04-28 ENCOUNTER — Other Ambulatory Visit: Payer: Self-pay | Admitting: Neurological Surgery

## 2024-04-28 ENCOUNTER — Encounter: Payer: Self-pay | Admitting: Neurological Surgery

## 2024-04-28 DIAGNOSIS — G950 Syringomyelia and syringobulbia: Secondary | ICD-10-CM

## 2024-05-11 ENCOUNTER — Ambulatory Visit
Admission: RE | Admit: 2024-05-11 | Discharge: 2024-05-11 | Disposition: A | Discharge status: Home / Self Care | Source: Ambulatory Visit | Attending: Neurological Surgery | Admitting: Neurological Surgery

## 2024-05-11 ENCOUNTER — Ambulatory Visit
Admission: RE | Admit: 2024-05-11 | Discharge: 2024-05-11 | Disposition: A | Source: Ambulatory Visit | Attending: Neurological Surgery | Admitting: Neurological Surgery

## 2024-05-11 DIAGNOSIS — G950 Syringomyelia and syringobulbia: Secondary | ICD-10-CM
